# Patient Record
Sex: Female | Born: 1955 | Race: White | Hispanic: No | Marital: Married | Smoking: Never smoker
Health system: Southern US, Community
[De-identification: ages and names within clinical notes are randomized; demographics above are authoritative.]

## PROBLEM LIST (undated history)

## (undated) DIAGNOSIS — R011 Cardiac murmur, unspecified: Secondary | ICD-10-CM

## (undated) DIAGNOSIS — F32A Depression, unspecified: Secondary | ICD-10-CM

## (undated) DIAGNOSIS — G43909 Migraine, unspecified, not intractable, without status migrainosus: Secondary | ICD-10-CM

## (undated) HISTORY — PX: ROTATOR CUFF REPAIR: SHX139

---

## 1998-07-27 ENCOUNTER — Ambulatory Visit (HOSPITAL_COMMUNITY): Admission: RE | Admit: 1998-07-27 | Discharge: 1998-07-27 | Payer: Self-pay | Admitting: Internal Medicine

## 1998-07-28 ENCOUNTER — Encounter: Payer: Self-pay | Admitting: Internal Medicine

## 1999-10-14 ENCOUNTER — Other Ambulatory Visit: Admission: RE | Admit: 1999-10-14 | Discharge: 1999-10-14 | Payer: Self-pay | Admitting: *Deleted

## 1999-10-14 ENCOUNTER — Encounter (INDEPENDENT_AMBULATORY_CARE_PROVIDER_SITE_OTHER): Payer: Self-pay

## 2002-09-07 ENCOUNTER — Emergency Department (HOSPITAL_COMMUNITY): Admission: EM | Admit: 2002-09-07 | Discharge: 2002-09-07 | Payer: Self-pay | Admitting: Emergency Medicine

## 2002-09-08 ENCOUNTER — Encounter: Payer: Self-pay | Admitting: Emergency Medicine

## 2002-09-09 ENCOUNTER — Encounter: Payer: Self-pay | Admitting: Internal Medicine

## 2002-09-09 ENCOUNTER — Encounter: Admission: RE | Admit: 2002-09-09 | Discharge: 2002-09-09 | Payer: Self-pay | Admitting: Internal Medicine

## 2002-09-11 ENCOUNTER — Encounter: Admission: RE | Admit: 2002-09-11 | Discharge: 2002-09-11 | Payer: Self-pay | Admitting: Internal Medicine

## 2002-09-11 ENCOUNTER — Encounter: Payer: Self-pay | Admitting: Internal Medicine

## 2002-10-16 ENCOUNTER — Other Ambulatory Visit: Admission: RE | Admit: 2002-10-16 | Discharge: 2002-10-16 | Payer: Self-pay | Admitting: *Deleted

## 2004-03-17 ENCOUNTER — Observation Stay (HOSPITAL_COMMUNITY): Admission: RE | Admit: 2004-03-17 | Discharge: 2004-03-18 | Payer: Self-pay | Admitting: Orthopedic Surgery

## 2007-12-17 ENCOUNTER — Encounter: Admission: RE | Admit: 2007-12-17 | Discharge: 2007-12-17 | Payer: Self-pay | Admitting: Emergency Medicine

## 2008-01-09 ENCOUNTER — Ambulatory Visit: Payer: Self-pay | Admitting: *Deleted

## 2008-01-09 ENCOUNTER — Encounter (INDEPENDENT_AMBULATORY_CARE_PROVIDER_SITE_OTHER): Payer: Self-pay | Admitting: Family Medicine

## 2008-01-09 ENCOUNTER — Ambulatory Visit (HOSPITAL_COMMUNITY): Admission: RE | Admit: 2008-01-09 | Discharge: 2008-01-09 | Payer: Self-pay | Admitting: Family Medicine

## 2008-02-26 ENCOUNTER — Encounter: Admission: RE | Admit: 2008-02-26 | Discharge: 2008-03-12 | Payer: Self-pay | Admitting: Family Medicine

## 2009-09-14 ENCOUNTER — Encounter: Admission: RE | Admit: 2009-09-14 | Discharge: 2009-09-14 | Payer: Self-pay | Admitting: Family Medicine

## 2011-01-20 NOTE — Op Note (Signed)
Michelle Donaldson, Michelle Donaldson                       ACCOUNT NO.:  [id number]   MEDICAL RECORD NO.:  [id number]                   PATIENT TYPE:  AMB   LOCATION:  DAY                                  FACILITY:  [facility]   PHYSICIAN:  Georges Lynch. Darrelyn Hillock, M.D.             DATE OF BIRTH:  February 21, 1956   DATE OF PROCEDURE:  03/17/2004  DATE OF DISCHARGE:                                 OPERATIVE REPORT   SURGEON:  Georges Lynch. Darrelyn Hillock, M.D.   ASSISTANT:  Ebbie Ridge. Paitsel, P.A.   PREOPERATIVE DIAGNOSIS:  Severe compression syndrome of the right shoulder  with a possible rotator cuff tendon tear.   POSTOPERATIVE DIAGNOSES:  Severe impingement syndrome right shoulder with  chronic subdeltoid bursitis.   DESCRIPTION OF PROCEDURE:  Prior to taking the patient back for general  anesthetic, an interscalene block was administered by the anesthesiologist  in the holding area.  Following that, we brought her back, gave her general  anesthetic, did a routine orthopedic prep and draping of the right shoulder.  An incision was made over the anterior aspect of the right shoulder.  Bleeders were identified and cauterized.  I stripped the deltoid tendon from  the acromion and then inspected the acromion per se.  This acromion was an  abnormal wide acromion.  It was thickened.  It was sloped down.  It was  impinging on her cuff.  In fact, she had a severe adhesive-type chronic  bursitis where the bursa literally was adhesed to the acromion.  We broke up  all those adhesions by removing the bursa.  I then put one finger in and  dissected the acromion away from the tendon.  Following this, I inspected  the rotator cuff.  It was intact.  I then protected the cuff with a Bennett  retractor, and we did a partial acromionectomy and an acromioplasty.  Note,  her distal clavicle was not embedding into the cuff.  She was free in that  respect.  We thoroughly irrigated out the area, reapproximated the deltoid  tendon and the  muscle in the usual fashion.  We then closed the subcu with 0  Vicryl, skin with metal staples.  A sterile Neosporin dressing was applied.  She was placed in a sling.  She had 1 g of IV Ancef preop.                                               Ronald A. Darrelyn Hillock, M.D.    RAG/MEDQ  D:  03/17/2004  T:  03/17/2004  Job:  [id number]

## 2011-09-12 ENCOUNTER — Ambulatory Visit (INDEPENDENT_AMBULATORY_CARE_PROVIDER_SITE_OTHER): Payer: Self-pay | Admitting: Family Medicine

## 2011-09-12 DIAGNOSIS — Z713 Dietary counseling and surveillance: Secondary | ICD-10-CM

## 2012-06-04 ENCOUNTER — Encounter (INDEPENDENT_AMBULATORY_CARE_PROVIDER_SITE_OTHER): Payer: 59

## 2012-06-04 DIAGNOSIS — R002 Palpitations: Secondary | ICD-10-CM

## 2013-09-04 DIAGNOSIS — F909 Attention-deficit hyperactivity disorder, unspecified type: Secondary | ICD-10-CM

## 2013-09-04 HISTORY — DX: Attention-deficit hyperactivity disorder, unspecified type: F90.9

## 2014-03-04 ENCOUNTER — Emergency Department (HOSPITAL_COMMUNITY)
Admission: EM | Admit: 2014-03-04 | Discharge: 2014-03-04 | Disposition: A | Discharge status: Home / Self Care | Payer: 59 | Attending: Emergency Medicine | Admitting: Emergency Medicine

## 2014-03-04 ENCOUNTER — Emergency Department (HOSPITAL_COMMUNITY): Payer: 59

## 2014-03-04 ENCOUNTER — Encounter (HOSPITAL_COMMUNITY): Payer: Self-pay | Admitting: Emergency Medicine

## 2014-03-04 DIAGNOSIS — Y9389 Activity, other specified: Secondary | ICD-10-CM | POA: Insufficient documentation

## 2014-03-04 DIAGNOSIS — S0990XA Unspecified injury of head, initial encounter: Secondary | ICD-10-CM | POA: Insufficient documentation

## 2014-03-04 DIAGNOSIS — W010XXA Fall on same level from slipping, tripping and stumbling without subsequent striking against object, initial encounter: Secondary | ICD-10-CM | POA: Insufficient documentation

## 2014-03-04 DIAGNOSIS — Y9289 Other specified places as the place of occurrence of the external cause: Secondary | ICD-10-CM | POA: Insufficient documentation

## 2014-03-04 DIAGNOSIS — S0003XA Contusion of scalp, initial encounter: Secondary | ICD-10-CM | POA: Insufficient documentation

## 2014-03-04 DIAGNOSIS — S0083XA Contusion of other part of head, initial encounter: Secondary | ICD-10-CM | POA: Insufficient documentation

## 2014-03-04 DIAGNOSIS — Z8669 Personal history of other diseases of the nervous system and sense organs: Secondary | ICD-10-CM | POA: Insufficient documentation

## 2014-03-04 DIAGNOSIS — S1093XA Contusion of unspecified part of neck, initial encounter: Secondary | ICD-10-CM

## 2014-03-04 DIAGNOSIS — Z91040 Latex allergy status: Secondary | ICD-10-CM | POA: Insufficient documentation

## 2014-03-04 HISTORY — DX: Migraine, unspecified, not intractable, without status migrainosus: G43.909

## 2014-03-04 MED ORDER — KETOROLAC TROMETHAMINE 60 MG/2ML IM SOLN
60.0000 mg | Freq: Once | INTRAMUSCULAR | Status: AC
  Administered 2014-03-04: 60 mg via INTRAMUSCULAR
  Filled 2014-03-04: qty 2

## 2014-03-04 NOTE — ED Provider Notes (Signed)
CSN: [id number]     Arrival date & time 03/04/14  1602 History   First MD Initiated Contact with Patient 03/04/14 1630     Chief Complaint  Patient presents with  . Fall  . Head Injury     (Consider location/radiation/quality/duration/timing/severity/associated sxs/prior Treatment) HPI Michelle Donaldson is a 58 y.o. female who presents to emergency department complaining of a head injury. Patient states she fell early this morning after tripping and hit her head on a brick step. Patient reports hematoma and in pain to the left for head. She states she did not have any loss of consciousness, states "I saw stars." She states she put some frozen peas to reduce the swelling which has helped some. She states she took Tylenol for pain which did not help her headache. She states since this morning after the fall, she has had persistent headache that is now on the right side. She admits to dizziness. She denies any nausea or vomiting. No confusion, no memory loss, no weakness or numbness in extremities. Denies any neck pain or back pain. No other injuries. She denies being on any blood thinners. No other complaints  Past Medical History  Diagnosis Date  . Migraine    Past Surgical History  Procedure Laterality Date  . Cesarean section    . Rotator cuff repair     No family history on file. History  Substance Use Topics  . Smoking status: Never Smoker   . Smokeless tobacco: Not on file  . Alcohol Use: Yes     Comment: rarely   OB History   Grav Para Term Preterm Abortions TAB SAB Ect Mult Living                 Review of Systems  Constitutional: Negative for fever and chills.  Respiratory: Negative for cough, chest tightness and shortness of breath.   Cardiovascular: Negative for chest pain, palpitations and leg swelling.  Gastrointestinal: Negative for nausea, vomiting, abdominal pain and diarrhea.  Musculoskeletal: Negative for arthralgias, myalgias, neck pain and neck stiffness.   Skin: Negative for rash.  Neurological: Positive for dizziness, light-headedness and headaches. Negative for syncope, speech difficulty, weakness and numbness.  All other systems reviewed and are negative.     Allergies  Latex  Home Medications   Prior to Admission medications   Not on File   BP 133/60  Pulse 77  Temp(Src) 98.4 F (36.9 C) (Oral)  Resp 16  SpO2 95% Physical Exam  Nursing note and vitals reviewed. Constitutional: She is oriented to person, place, and time. She appears well-developed and well-nourished. No distress.  HENT:  Head: Normocephalic.  Right Ear: External ear normal.  Left Ear: External ear normal.  Small hematoma to the left forehead  Eyes: Conjunctivae and EOM are normal. Pupils are equal, round, and reactive to light.  Neck: Normal range of motion. Neck supple.  Cardiovascular: Normal rate, regular rhythm and normal heart sounds.   Pulmonary/Chest: Effort normal and breath sounds normal. No respiratory distress. She has no wheezes. She has no rales.  Abdominal: Soft. Bowel sounds are normal. She exhibits no distension. There is no tenderness. There is no rebound.  Musculoskeletal: She exhibits no edema.  No cervical, thoracic, lumbar spine tenderness  Neurological: She is alert and oriented to person, place, and time.  5/5 and equal upper and lower extremity strength bilaterally. Equal grip strength bilaterally. Normal finger to nose and heel to shin. No pronator drift.   Skin: Skin is  warm and dry.  Psychiatric: She has a normal mood and affect. Her behavior is normal.    ED Course  Procedures (including critical care time) Labs Review Labs Reviewed - No data to display  Imaging Review Ct Head Wo Contrast  03/04/2014   CLINICAL DATA:  Head trauma status post fall  EXAM: CT HEAD WITHOUT CONTRAST  TECHNIQUE: Contiguous axial images were obtained from the base of the skull through the vertex without intravenous contrast.  COMPARISON:  None.   FINDINGS: The ventricles are normal in size and position. There is no intracranial hemorrhage nor intracranial mass effect. No acute ischemic changes demonstrated. The cerebellum and brainstem are normal. The observed paranasal sinuses and mastoid air cells are clear. There is no acute skull fracture. No significant cephalohematoma is demonstrated.  IMPRESSION: There is no acute intracranial abnormality.   Electronically Signed   By: David  Swazil[place]   On: 03/04/2014 17:28     EKG Interpretation None      MDM   Final diagnoses:  Minor head injury, initial encounter    Pt here after a head injury, not on blood thinners, normal neurological exam. Pt requesting CT, pt does have dizziness and severe headache, and I do think it may be appropriate to get one.   5:45 PM CT head negative. Patient reassured. She has no other complaints. Toradol 60 mg given IM for her headache. We'll discharge home with outpatient followup as needed. Precautions discussed.  Filed Vitals:   03/04/14 1614  BP: 133/60  Pulse: 77  Temp: 98.4 F (36.9 C)  Resp: 16     Jailani Hogans A Odes Lolli, PA-C 03/04/14 2332

## 2014-03-04 NOTE — ED Notes (Addendum)
Pt c/o headache after tripping and falling and hitting the L side of her head on a brick step. Pt denies LOC, or getting dizzy and falling. Pt sts that "she saw stars" right after the incident but never blacked out. Pt denies dizziness, lightheadedness, N/V at this time. Pt was persuaded by husband to "come get checked out" and would also like treatment for headache. Pt has hx of migraines. Pt has knot on L side of face above eyebrow. Pt put ice on it right after she fell. Pt only c/o headache. Pt sts that when she bends down she become dizzy.

## 2014-03-04 NOTE — Discharge Instructions (Signed)
Continue tylenol or motrin for headache. Ice the swollen area. Follow up with your doctor. Return if worsening symptoms.    Head Injury You have received a head injury. It does not appear serious at this time. Headaches and vomiting are common following head injury. It should be easy to awaken from sleeping. Sometimes it is necessary for you to stay in the emergency department for a while for observation. Sometimes admission to the hospital may be needed. After injuries such as yours, most problems occur within the first 24 hours, but side effects may occur up to 7-10 days after the injury. It is important for you to carefully monitor your condition and contact your health care provider or seek immediate medical care if there is a change in your condition. WHAT ARE THE TYPES OF HEAD INJURIES? Head injuries can be as minor as a bump. Some head injuries can be more severe. More severe head injuries include:  A jarring injury to the brain (concussion).  A bruise of the brain (contusion). This mean there is bleeding in the brain that can cause swelling.  A cracked skull (skull fracture).  Bleeding in the brain that collects, clots, and forms a bump (hematoma). WHAT CAUSES A HEAD INJURY? A serious head injury is most likely to happen to someone who is in a car wreck and is not wearing a seat belt. Other causes of major head injuries include bicycle or motorcycle accidents, sports injuries, and falls. HOW ARE HEAD INJURIES DIAGNOSED? A complete history of the event leading to the injury and your current symptoms will be helpful in diagnosing head injuries. Many times, pictures of the brain, such as CT or MRI are needed to see the extent of the injury. Often, an overnight hospital stay is necessary for observation.  WHEN SHOULD I SEEK IMMEDIATE MEDICAL CARE?  You should get help right away if:  You have confusion or drowsiness.  You feel sick to your stomach (nauseous) or have continued, forceful  vomiting.  You have dizziness or unsteadiness that is getting worse.  You have severe, continued headaches not relieved by medicine. Only take over-the-counter or prescription medicines for pain, fever, or discomfort as directed by your health care provider.  You do not have normal function of the arms or legs or are unable to walk.  You notice changes in the black spots in the center of the colored part of your eye (pupil).  You have a clear or bloody fluid coming from your nose or ears.  You have a loss of vision. During the next 24 hours after the injury, you must stay with someone who can watch you for the warning signs. This person should contact local emergency services (911 in the [place] if you have seizures, you become unconscious, or you are unable to wake up. HOW CAN I PREVENT A HEAD INJURY IN THE FUTURE? The most important factor for preventing major head injuries is avoiding motor vehicle accidents. To minimize the potential for damage to your head, it is crucial to wear seat belts while riding in motor vehicles. Wearing helmets while bike riding and playing collision sports (like football) is also helpful. Also, avoiding dangerous activities around the house will further help reduce your risk of head injury.  WHEN CAN I RETURN TO NORMAL ACTIVITIES AND ATHLETICS? You should be reevaluated by your health care provider before returning to these activities. If you have any of the following symptoms, you should not return to activities or contact sports until  1 week after the symptoms have stopped:  Persistent headache.  Dizziness or vertigo.  Poor attention and concentration.  Confusion.  Memory problems.  Nausea or vomiting.  Fatigue or tire easily.  Irritability.  Intolerant of bright lights or loud noises.  Anxiety or depression.  Disturbed sleep. MAKE SURE YOU:   Understand these instructions.  Will watch your condition.  Will get help right away if you are  not doing well or get worse. Document Released: 08/21/2005 Document Revised: 08/26/2013 Document Reviewed: 04/28/2013 [facility] Patient Information 2015 [employer], [place]. This information is not intended to replace advice given to you by your health care provider. Make sure you discuss any questions you have with your health care provider.

## 2014-03-05 NOTE — ED Provider Notes (Signed)
Medical screening examination/treatment/procedure(s) were performed by non-physician practitioner and as supervising physician I was immediately available for consultation/collaboration.  Toy BakerAnthony T Amiere Cawley, MD 03/05/14 [phone]

## 2014-09-16 ENCOUNTER — Ambulatory Visit: Payer: 59 | Admitting: Podiatry

## 2014-12-09 ENCOUNTER — Encounter: Payer: Self-pay | Admitting: Podiatry

## 2014-12-09 ENCOUNTER — Ambulatory Visit (INDEPENDENT_AMBULATORY_CARE_PROVIDER_SITE_OTHER): Payer: 59

## 2014-12-09 ENCOUNTER — Ambulatory Visit (INDEPENDENT_AMBULATORY_CARE_PROVIDER_SITE_OTHER): Payer: 59 | Admitting: Podiatry

## 2014-12-09 VITALS — BP 120/84 | HR 84

## 2014-12-09 DIAGNOSIS — R52 Pain, unspecified: Secondary | ICD-10-CM

## 2014-12-09 DIAGNOSIS — L84 Corns and callosities: Secondary | ICD-10-CM | POA: Diagnosis not present

## 2014-12-09 DIAGNOSIS — M205X9 Other deformities of toe(s) (acquired), unspecified foot: Secondary | ICD-10-CM

## 2014-12-09 NOTE — Progress Notes (Signed)
   Subjective:    Patient ID: Kathrin GreathouseLinda B Governale, female    DOB: 01/19/1956, 59 y.o.   MRN: [id number]  HPI  59 year old female presents the office they with a painful corn on the lateral aspect of the left fourth digit. She states that she has been using corn remover pads on it without much resolution. She previously states that she has surgery on the toe proximal 30 years ago to remove a bone spur for similar issue here he states she states the last couple months and started to recur. Denies any history of injury or trauma. Denies any overlying swallowing or drainage. No other complaints at this time.    Review of Systems  All other systems reviewed and are negative.      Objective:   Physical Exam  AAO 3, NAD DP/PT pulses palpable, CRT less than 3 seconds  Protective sensation intact with Simms weinstein monofilament, vibratory sensation intact, Achilles tendon reflex intact. On the lateral aspect left fourth toe there is a hyperkeratotic lesion at the level of the PIPJ. 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 There is no overlying edema to the digits. There is no erythema or increase in warmth. Upon debridement lesion there is no underlying ulceration, drainage or other clinical signs of infection. No other areas of tenderness to bilateral lower extremities. MMT 5/5, ROM WNL No open lesions, pre-ulcerative lesions No pain with calf compression, swelling, warmth, erythema.      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 -Treatment options were discussed including alternatives, risks, complications. I discussed both conservative and surgical options. -Hyperkeratotic lesion sharply debrided without, occasions. A pad was placed around the area followed by salinocaine and a bandage. Post procedure care was discussed the patient.  Monitor for signs or symptoms of infection to call the office if any is to occur. Also discussed toe separators as well offloading pads. Follow-up as needed. In the meantime encouraged to call the office with any questions, concerns, changes symptoms.

## 2016-12-01 DIAGNOSIS — H9193 Unspecified hearing loss, bilateral: Secondary | ICD-10-CM | POA: Insufficient documentation

## 2016-12-01 DIAGNOSIS — H6983 Other specified disorders of Eustachian tube, bilateral: Secondary | ICD-10-CM | POA: Insufficient documentation

## 2016-12-01 DIAGNOSIS — T161XXA Foreign body in right ear, initial encounter: Secondary | ICD-10-CM | POA: Insufficient documentation

## 2016-12-01 DIAGNOSIS — H6993 Unspecified Eustachian tube disorder, bilateral: Secondary | ICD-10-CM | POA: Insufficient documentation

## 2016-12-01 DIAGNOSIS — J342 Deviated nasal septum: Secondary | ICD-10-CM | POA: Insufficient documentation

## 2016-12-21 ENCOUNTER — Ambulatory Visit (INDEPENDENT_AMBULATORY_CARE_PROVIDER_SITE_OTHER): Payer: 59 | Admitting: Podiatry

## 2016-12-21 ENCOUNTER — Ambulatory Visit (INDEPENDENT_AMBULATORY_CARE_PROVIDER_SITE_OTHER): Payer: 59

## 2016-12-21 DIAGNOSIS — M79672 Pain in left foot: Secondary | ICD-10-CM

## 2016-12-21 DIAGNOSIS — L84 Corns and callosities: Secondary | ICD-10-CM | POA: Diagnosis not present

## 2016-12-21 DIAGNOSIS — L989 Disorder of the skin and subcutaneous tissue, unspecified: Secondary | ICD-10-CM | POA: Diagnosis not present

## 2016-12-21 NOTE — Progress Notes (Signed)
Subjective: 61 year old female presents the office today for concerns of recurrent lesion to the left fourth toe which is been ongoing for about 6-8 months. She says the areas painful pressure in shoes. She states that she was doing well up until about a month ago when it started to worsen. Denies CBC injury. No swelling or redness or any drainage from the area. Denies any systemic complaints such as fevers, chills, nausea, vomiting. No acute changes since last appointment, and no other complaints at this time.   Objective: AAO x3, NAD DP/PT pulses palpable bilaterally, CRT less than 3 seconds Prominences present on the lateral aspect of the left fourth digit was abutting the fifth toe. This hyperkeratotic lesion the lateral fourth toe. Upon debridement no underlying ulceration, drainage or any signs of infection. No other open lesions or pre-ulceration. There is no pain with calf compression, swelling, warmth, erythema.  Assessment: Hyperkeratotic lesion left fourth toe   Plan: -Treatment options discussed including all alternatives, risks, and complications -Etiology of symptoms were discussed -X-rays were obtained and reviewed with the patient. No significant bony exostosis present. No evidence of acute fracture.  -Lesion was sharply debrided 1 without complications or bleeding. Area was cleaned and a pad was placed followed by salicylic acid and a bandage. Post procedure first discussed. Monitor for infection.  -RTC is symptoms worsen or fail to improve.   Ovid Curd, DPM

## 2017-01-09 DIAGNOSIS — J302 Other seasonal allergic rhinitis: Secondary | ICD-10-CM | POA: Insufficient documentation

## 2017-06-14 ENCOUNTER — Ambulatory Visit
Admission: RE | Admit: 2017-06-14 | Discharge: 2017-06-14 | Disposition: A | Discharge status: Home / Self Care | Payer: 59 | Source: Ambulatory Visit | Attending: Family Medicine | Admitting: Family Medicine

## 2017-06-14 ENCOUNTER — Other Ambulatory Visit: Payer: Self-pay | Admitting: Family Medicine

## 2017-06-14 DIAGNOSIS — R609 Edema, unspecified: Secondary | ICD-10-CM

## 2017-06-19 ENCOUNTER — Other Ambulatory Visit: Payer: Self-pay | Admitting: Family Medicine

## 2017-06-19 DIAGNOSIS — R221 Localized swelling, mass and lump, neck: Secondary | ICD-10-CM

## 2017-06-20 ENCOUNTER — Ambulatory Visit
Admission: RE | Admit: 2017-06-20 | Discharge: 2017-06-20 | Disposition: A | Discharge status: Home / Self Care | Payer: 59 | Source: Ambulatory Visit | Attending: Family Medicine | Admitting: Family Medicine

## 2017-06-20 DIAGNOSIS — R221 Localized swelling, mass and lump, neck: Secondary | ICD-10-CM

## 2017-06-27 ENCOUNTER — Other Ambulatory Visit: Payer: Self-pay | Admitting: Family Medicine

## 2017-06-27 DIAGNOSIS — R222 Localized swelling, mass and lump, trunk: Secondary | ICD-10-CM

## 2017-06-29 ENCOUNTER — Other Ambulatory Visit: Payer: 59

## 2017-07-03 ENCOUNTER — Ambulatory Visit
Admission: RE | Admit: 2017-07-03 | Discharge: 2017-07-03 | Disposition: A | Discharge status: Home / Self Care | Payer: 59 | Source: Ambulatory Visit | Attending: Family Medicine | Admitting: Family Medicine

## 2017-07-03 DIAGNOSIS — R222 Localized swelling, mass and lump, trunk: Secondary | ICD-10-CM

## 2017-07-03 MED ORDER — IOPAMIDOL (ISOVUE-300) INJECTION 61%
75.0000 mL | Freq: Once | INTRAVENOUS | Status: AC | PRN
  Administered 2017-07-03: 75 mL via INTRAVENOUS

## 2017-07-05 ENCOUNTER — Ambulatory Visit (INDEPENDENT_AMBULATORY_CARE_PROVIDER_SITE_OTHER): Payer: 59 | Admitting: Podiatry

## 2017-07-05 ENCOUNTER — Encounter: Payer: Self-pay | Admitting: Podiatry

## 2017-07-05 DIAGNOSIS — L989 Disorder of the skin and subcutaneous tissue, unspecified: Secondary | ICD-10-CM | POA: Diagnosis not present

## 2017-07-05 DIAGNOSIS — M2042 Other hammer toe(s) (acquired), left foot: Secondary | ICD-10-CM | POA: Diagnosis not present

## 2017-07-09 NOTE — Progress Notes (Signed)
Subjective: 61 year old female presents the office today for concerns of recurrent lesion to the left fourth toe. She presents today to have the area trimmed that she is also interested in surgical intervention to help decrease the pain and deformity to help eliminate the callus. Which she has tried periodic trimmings as well as offloading pads in shoe changes without any significant improvement. She denies any open sores to the area and she denies any swelling or redness or drainage. She has no other concerns today. Denies any systemic complaints such as fevers, chills, nausea, vomiting. No acute changes since last appointment, and no other complaints at this time.   Objective: AAO x3, NAD DP/PT pulses palpable bilaterally, CRT less than 3 seconds Prominences present on the lateral aspect of the left fourth digit was abutting the fifth toe. This hyperkeratotic lesion the lateral fourth toe. Upon debridement no underlying ulceration, drainage or any signs of infection. No other open lesions or pre-ulceration. There is no pain with calf compression, swelling, warmth, erythema.  Assessment: Hyperkeratotic lesion left fourth toe   Plan: -Treatment options discussed including all alternatives, risks, and complications -Etiology of symptoms were discussed -Hyperkeratotic lesion was sharply debrided 1 without any complications or bleeding. -We discussed surgical intervention including arthroplasty of the fourth and fifth toes to help take pressure off the digits. I discussed with her the surgery as well as the postoperative course. She may consider her options. I gave her the number to contact our surgery scheduler if she wishes to proceed with surgery. I will need to see her back for consent forms.  -RTC is symptoms worsen or fail to improve.   Ovid CurdMatthew Wagoner, DPM

## 2017-08-08 ENCOUNTER — Other Ambulatory Visit: Payer: Self-pay | Admitting: Family Medicine

## 2017-08-08 ENCOUNTER — Ambulatory Visit
Admission: RE | Admit: 2017-08-08 | Discharge: 2017-08-08 | Disposition: A | Discharge status: Home / Self Care | Payer: 59 | Source: Ambulatory Visit | Attending: Family Medicine | Admitting: Family Medicine

## 2017-08-08 DIAGNOSIS — M79604 Pain in right leg: Secondary | ICD-10-CM

## 2017-09-04 HISTORY — PX: TOE SURGERY: SHX1073

## 2017-12-04 DIAGNOSIS — K219 Gastro-esophageal reflux disease without esophagitis: Secondary | ICD-10-CM | POA: Insufficient documentation

## 2017-12-04 DIAGNOSIS — R490 Dysphonia: Secondary | ICD-10-CM | POA: Insufficient documentation

## 2018-03-09 IMAGING — US US SOFT TISSUE EXCLUDE HEAD/NECK
1 series · 14 of 16 positions shown · non-contrast
Comparison: None.

CLINICAL DATA: Palpable mass left chest

EXAM:
ULTRASOUND OF HEAD/NECK SOFT TISSUES
TECHNIQUE: Ultrasound examination of the head and neck soft tissues was
performed in the area of clinical concern.

[Series 1: us soft tissue exclude head/neck · 0.15mm/px · 16 acquisitions, 14 frames shown]
[im 1/16]
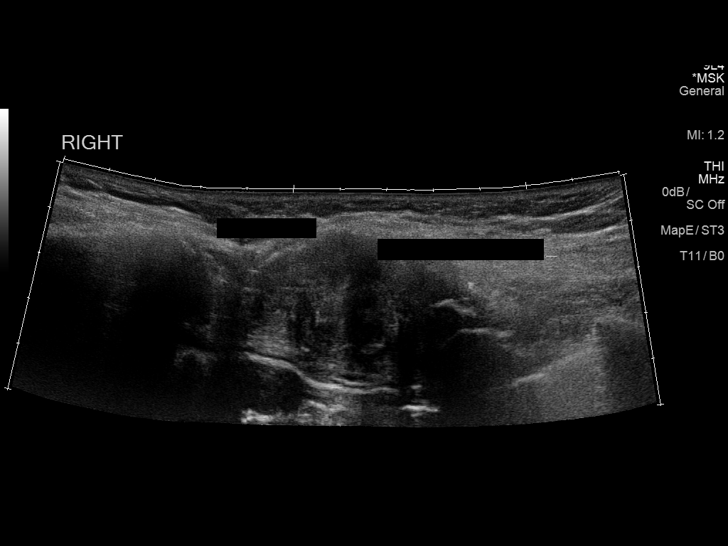
[im 2/16]
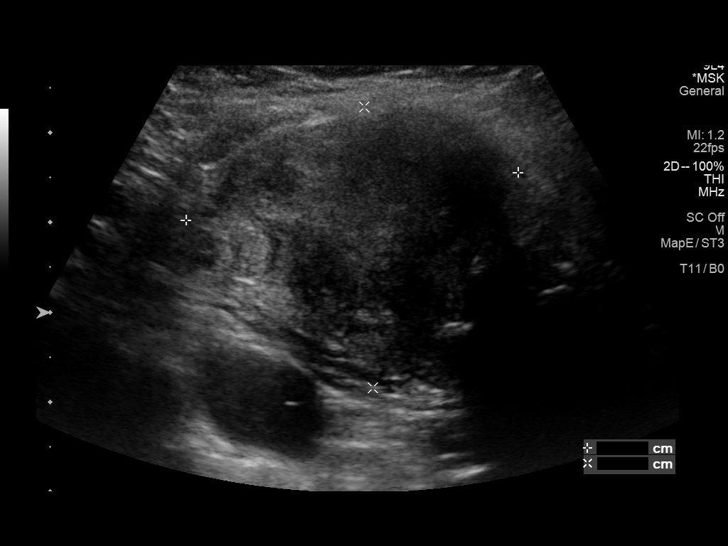
[im 3/16]
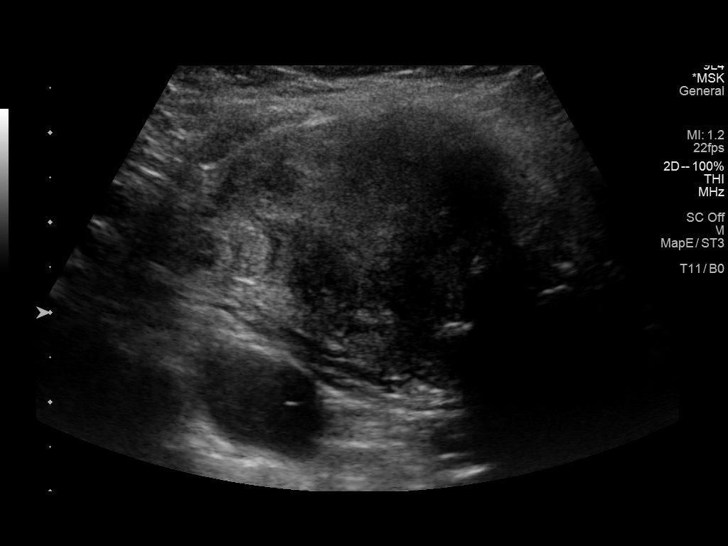
[im 5/16]
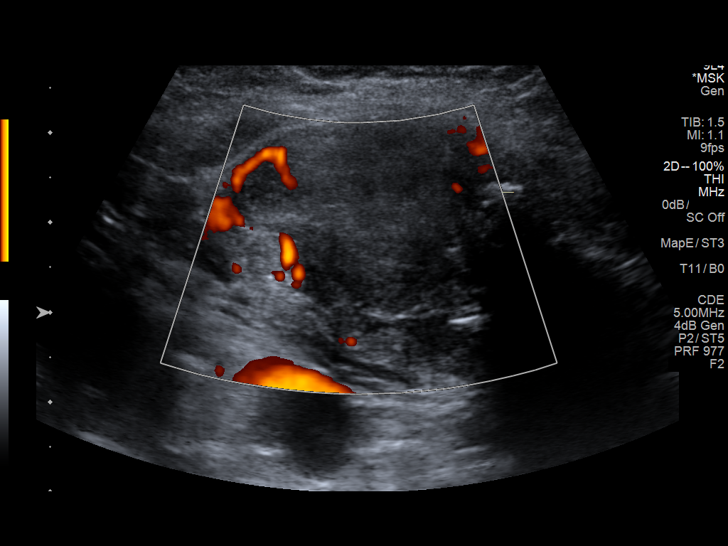
[im 6/16]
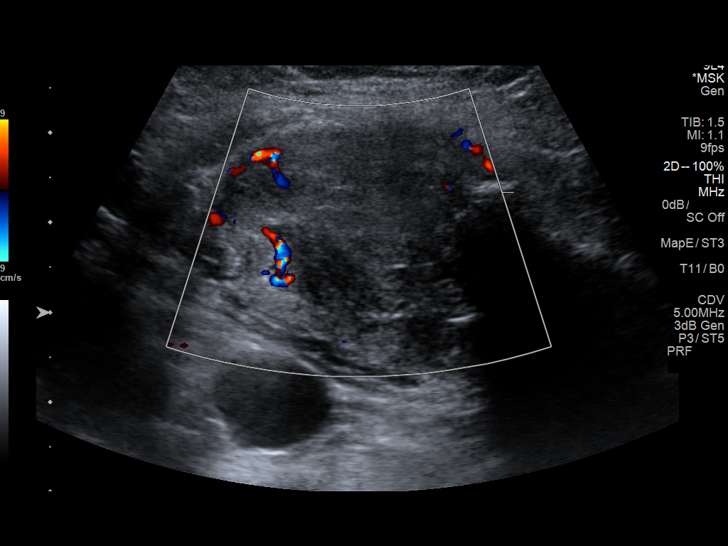
[im 7/16]
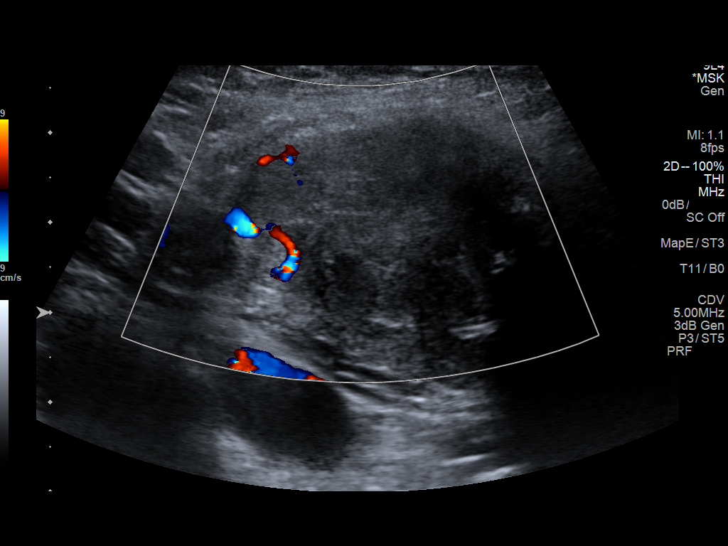
[im 8/16]
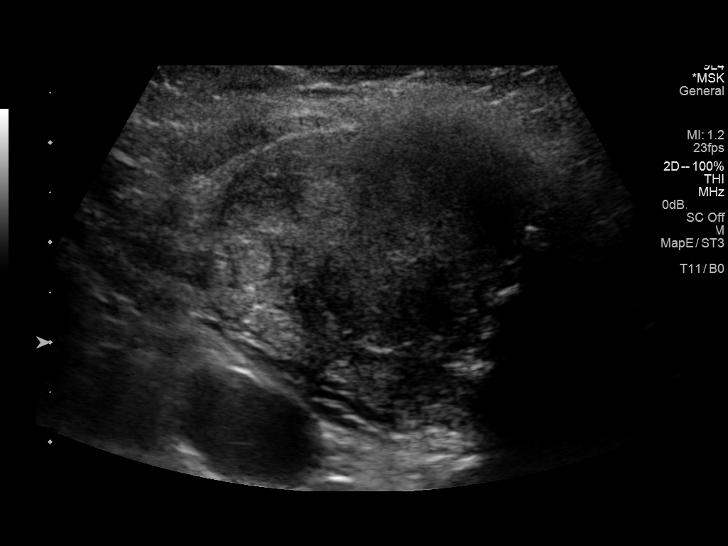
[im 9/16]
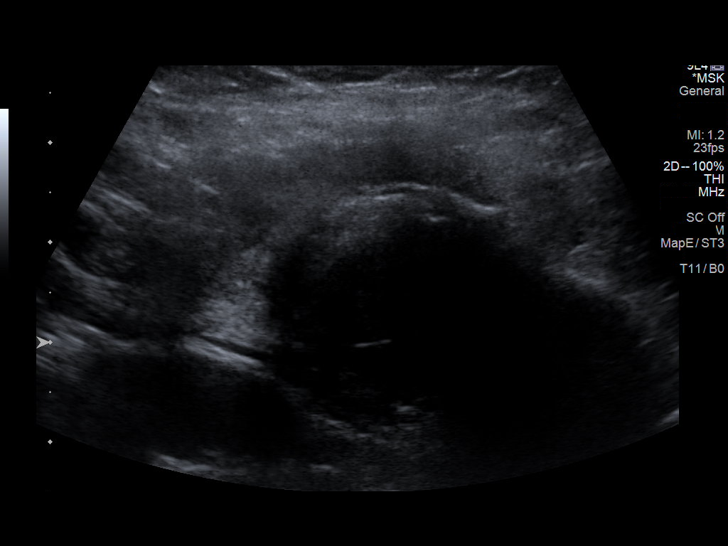
[im 10/16]
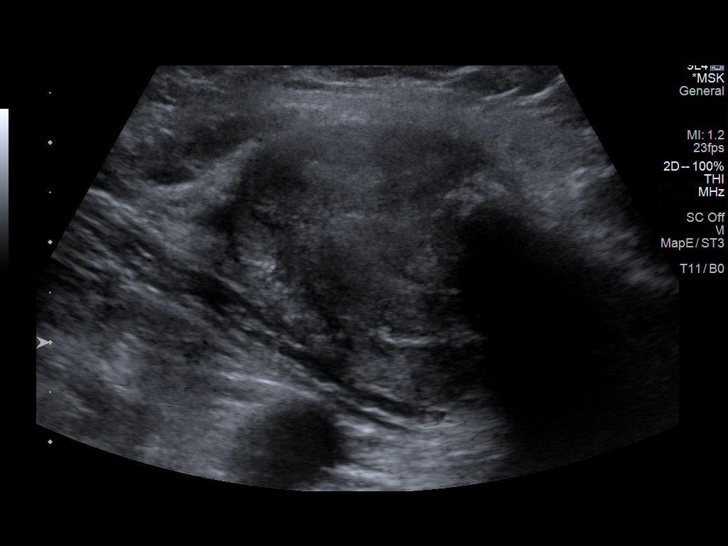
[im 11/16]
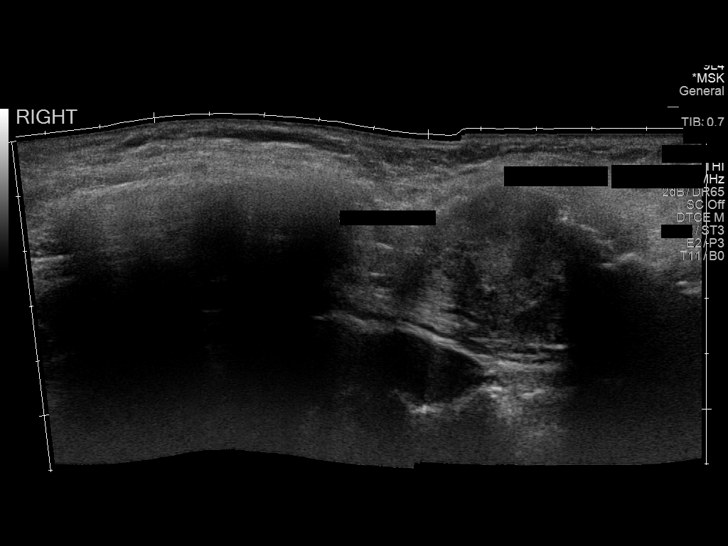
[im 13/16]
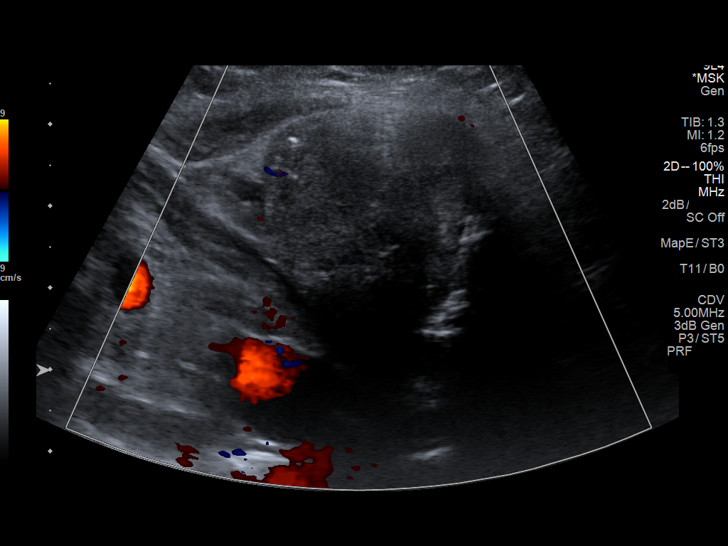
[im 14/16]
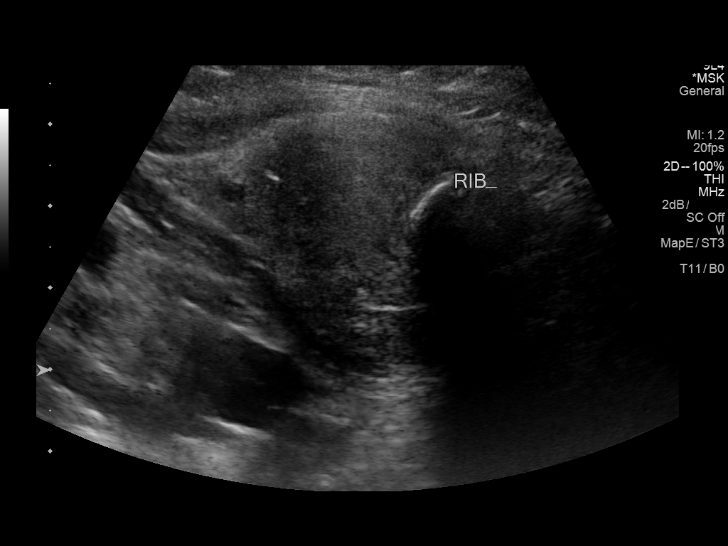
[im 15/16]
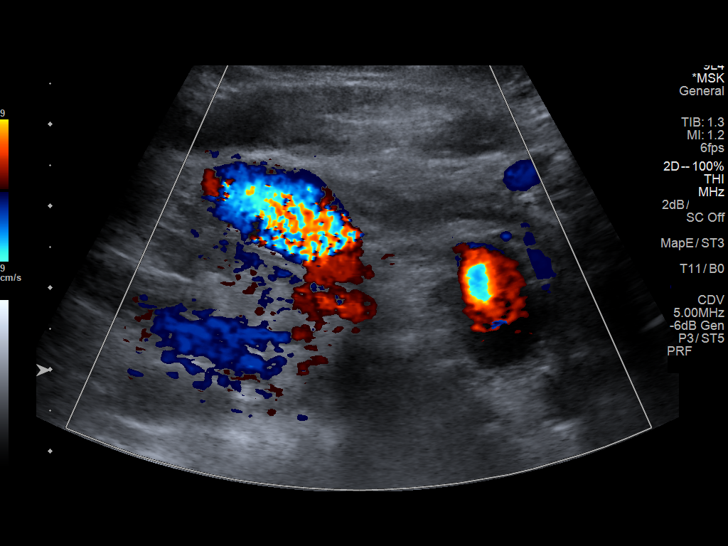
[im 16/16]
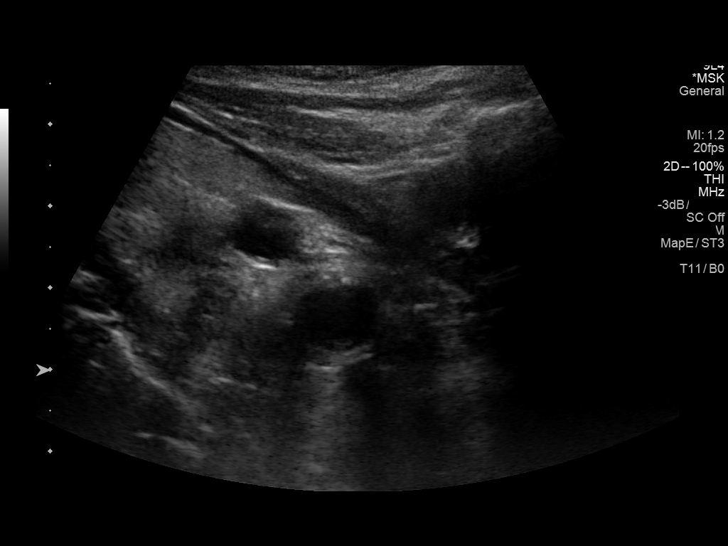

[14 of 16 positions shown; findings below may reference images not displayed]

FINDINGS: Targeted sonography of the area of concern is performed. This is
labeled as between the left medial clavicle and rib.

Corresponding to the palpable mass is a solid appearing slightly
heterogeneous hypoechoic mass measuring 3.7 x 3.1 x 2.8 cm. Internal
flow is present.
IMPRESSION: 3.7 cm solid-appearing mass corresponding to the palpable
abnormality between the medial end of left clavicle and rib. Further
evaluation with CT and/or MRI is recommended.

These results will be called to the ordering clinician or
representative by the Radiologist Assistant, and communication
documented in the PACS or zVision Dashboard.

## 2018-07-15 ENCOUNTER — Ambulatory Visit: Payer: 59 | Admitting: Podiatry

## 2018-07-15 ENCOUNTER — Ambulatory Visit (INDEPENDENT_AMBULATORY_CARE_PROVIDER_SITE_OTHER): Payer: 59

## 2018-07-15 DIAGNOSIS — M7752 Other enthesopathy of left foot: Secondary | ICD-10-CM

## 2018-07-15 DIAGNOSIS — M205X2 Other deformities of toe(s) (acquired), left foot: Secondary | ICD-10-CM

## 2018-07-15 DIAGNOSIS — L84 Corns and callosities: Secondary | ICD-10-CM

## 2018-07-15 DIAGNOSIS — M898X9 Other specified disorders of bone, unspecified site: Secondary | ICD-10-CM | POA: Diagnosis not present

## 2018-07-15 DIAGNOSIS — M79672 Pain in left foot: Secondary | ICD-10-CM

## 2018-07-16 ENCOUNTER — Other Ambulatory Visit: Payer: Self-pay | Admitting: Podiatry

## 2018-07-16 DIAGNOSIS — M7752 Other enthesopathy of left foot: Secondary | ICD-10-CM

## 2018-07-17 NOTE — Progress Notes (Signed)
Subjective: 62 year old female presents the office today for concerns of recurrent lesion to the left fourth toe.  She states that she tries to trim herself but is still causing quite a bit of pain.  She does relate that she had surgery to her fourth and fifth toe several years ago and did well however has been causing pain in the last couple years.  She denies any redness or drainage or any swelling or pus.  No recent injury or trauma.  His pain with pressure in certain shoes.  She has no other concerns.  Denies any systemic complaints such as fevers, chills, nausea, vomiting. No acute changes since last appointment, and no other complaints at this time.   Objective: AAO x3, NAD DP/PT pulses palpable bilaterally, CRT less than 3 seconds Prominences present on the lateral aspect of the left fourth digit was abutting the fifth toe. This hyperkeratotic lesion the lateral fourth toe. Upon debridement no underlying ulceration, drainage or any signs of infection.  Adductovarus present fifth toe No other open lesions or pre-ulceration. There is no pain with calf compression, swelling, warmth, erythema.  Assessment: Hyperkeratotic lesion left fourth toe   Plan: -Treatment options discussed including all alternatives, risks, and complications -X-rays were obtained reviewed.  Small exostosis lateral fourth PIPJ.  Adductovarus present of the fifth toe.  No evidence of acute fracture. -Hyperkeratotic lesion was sharply debrided 1 without any complications or bleeding. -We discussed surgical intervention including arthroplasty of the fourth and fifth toes to help take pressure off the digits.  She is to consider doing this in the spring of next year. -RTC is symptoms worsen or fail to improve.   Ovid CurdMatthew Esha Fincher, DPM

## 2018-09-19 ENCOUNTER — Ambulatory Visit: Payer: 59 | Admitting: Podiatry

## 2018-09-19 ENCOUNTER — Ambulatory Visit (INDEPENDENT_AMBULATORY_CARE_PROVIDER_SITE_OTHER): Payer: 59

## 2018-09-19 ENCOUNTER — Other Ambulatory Visit: Payer: Self-pay | Admitting: Podiatry

## 2018-09-19 ENCOUNTER — Encounter: Payer: Self-pay | Admitting: Podiatry

## 2018-09-19 DIAGNOSIS — M898X9 Other specified disorders of bone, unspecified site: Secondary | ICD-10-CM | POA: Diagnosis not present

## 2018-09-19 DIAGNOSIS — L84 Corns and callosities: Secondary | ICD-10-CM

## 2018-09-19 DIAGNOSIS — M2042 Other hammer toe(s) (acquired), left foot: Secondary | ICD-10-CM | POA: Diagnosis not present

## 2018-09-19 NOTE — Patient Instructions (Signed)

## 2018-09-22 NOTE — Progress Notes (Signed)
Subjective: 63 year old female presents the office today for concerns of recurrent painful corn on the left fourth toe when she wants to discuss surgical intervention.  She said the last time did not last as long as it normally has not started to come back quicker.  She is attempted some over-the-counter acid treatments but this is been helping some and she comes in for periodic debridement.  She is also tried changing shoes without any significant improvement.  Due to the lesion, back very quickly she wants to discuss surgical intervention.  She currently denies any drainage or pus or any swelling. Denies any systemic complaints such as fevers, chills, nausea, vomiting. No acute changes since last appointment, and no other complaints at this time.   Objective: AAO x3, NAD DP/PT pulses palpable bilaterally, CRT less than 3 seconds There is prominence along the lateral aspect of the fourth toe along the DIPJ and PIPJ resulting hyperkeratotic lesion.  Mild digital deformities present to the lesser digits.  Upon debridement of the hyperkeratotic lesion there is no underlying ulceration, drainage or any clinical signs of infection noted today. No open lesions or pre-ulcerative lesions.  No pain with calf compression, swelling, warmth, erythema  Assessment: Hyperkeratotic lesion left fourth toe  Plan: -All treatment options discussed with the patient including all alternatives, risks, complications.  -Today new x-rays were obtained reviewed a skin marker was utilized to identify the area of the soft tissue mass.  No evidence of acute fracture. -We discussed both conservative as well as surgical treatment options.  This point she wants to consider surgical intervention.  We discussed multiple options however we discussed ultimately doing an exostectomy of the left fourth toe.  We discussed the surgery as well as the postoperative course.  She wishes to proceed with surgery for exostectomy of the left  fourth toe. -The incision placement as well as the postoperative course was discussed with the patient. I discussed risks of the surgery which include, but not limited to, infection, bleeding, pain, swelling, need for further surgery, delayed or nonhealing, painful or ugly scar, numbness or sensation changes, over/under correction, recurrence, transfer lesions, further deformity, hardware failure, DVT/PE, loss of toe/foot. Patient understands these risks and wishes to proceed with surgery. The surgical consent was reviewed with the patient all 3 pages were signed. No promises or guarantees were given to the outcome of the procedure. All questions were answered to the best of my ability. Before the surgery the patient was encouraged to call the office if there is any further questions. The surgery will be performed at the [facility] on an outpatient basis. -This was scheduled for February 12. -Patient encouraged to call the office with any questions, concerns, change in symptoms.   Vivi Barrack DPM

## 2018-10-14 ENCOUNTER — Telehealth: Payer: Self-pay | Admitting: *Deleted

## 2018-10-14 NOTE — Telephone Encounter (Signed)
"  I'm scheduled to have surgery on Wednesday with Dr. Ardelle Anton.  What time am I supposed to be there? Can I get a note for my job letting them know how long I'm going to be out of work after I have my surgery?"  Someone from the surgical center will call you a day or two prior to your surgery date and they will give you a call.  However, if you want to call them, their number is [phone].  I can transfer you to [employer] if you would like in regards to your note for work.  She'll be here on Wednesday.  "That will be fine."

## 2018-10-16 ENCOUNTER — Other Ambulatory Visit: Payer: Self-pay | Admitting: Podiatry

## 2018-10-16 ENCOUNTER — Encounter: Payer: Self-pay | Admitting: Podiatry

## 2018-10-16 DIAGNOSIS — M25775 Osteophyte, left foot: Secondary | ICD-10-CM | POA: Diagnosis not present

## 2018-10-16 MED ORDER — HYDROCODONE-ACETAMINOPHEN 5-325 MG PO TABS: 1.0000 | ORAL_TABLET | ORAL | 0 refills | Status: AC | PRN

## 2018-10-16 MED ORDER — CEPHALEXIN 500 MG PO CAPS: 500.0000 mg | ORAL_CAPSULE | Freq: Three times a day (TID) | ORAL | 0 refills | Status: DC

## 2018-10-16 MED ORDER — PROMETHAZINE HCL 25 MG PO TABS: 25.0000 mg | ORAL_TABLET | Freq: Three times a day (TID) | ORAL | 0 refills | Status: DC | PRN

## 2018-10-16 NOTE — Progress Notes (Signed)
Post-op medications sent to the pharmacy.  

## 2018-10-18 ENCOUNTER — Telehealth: Payer: Self-pay | Admitting: *Deleted

## 2018-10-18 NOTE — Telephone Encounter (Signed)
Called and left a message to call the  office at [phone] to see how she was doing after surgery on Wednesday October 16, 2018 with Dr Ardelle Anton. Misty Stanley

## 2018-10-21 ENCOUNTER — Ambulatory Visit (INDEPENDENT_AMBULATORY_CARE_PROVIDER_SITE_OTHER): Payer: 59

## 2018-10-21 ENCOUNTER — Ambulatory Visit (INDEPENDENT_AMBULATORY_CARE_PROVIDER_SITE_OTHER): Payer: Self-pay | Admitting: Podiatry

## 2018-10-21 VITALS — Temp 97.6°F

## 2018-10-21 DIAGNOSIS — M205X2 Other deformities of toe(s) (acquired), left foot: Secondary | ICD-10-CM | POA: Diagnosis not present

## 2018-10-21 DIAGNOSIS — M898X9 Other specified disorders of bone, unspecified site: Secondary | ICD-10-CM

## 2018-10-21 DIAGNOSIS — M2042 Other hammer toe(s) (acquired), left foot: Secondary | ICD-10-CM

## 2018-10-29 NOTE — Progress Notes (Signed)
Subjective: Michelle Donaldson is a 63 y.o. is seen today in office s/p left fourth toe exostectomy preformed on 10/16/2018.  Overall her pain is been controlled.  She is wearing the surgical shoe.  She states that she has felt feverish but she has not had a fever and she denies any chills, nausea, vomiting.  Denies any calf pain, chest pain, shortness of breath.    Objective: General: No acute distress, AAOx3  DP/PT pulses palpable 2/4, CRT < 3 sec to all digits.  Protective sensation intact. Motor function intact.  LEFT foot: Incision is well coapted without any evidence of dehiscence.  There is mild erythema to the dorsal aspect the toe but this is more from swelling and inflammation as opposed to infection.  There is no erythema to the plantar aspect of the toe and there is no ascending cellulitis.  There is no drainage or pus coming from the toe.  There is no increase in warmth to the toe or foot.  No other areas of tenderness to bilateral lower extremities.  No other open lesions or pre-ulcerative lesions.  No pain with calf compression, swelling, warmth, erythema.   Assessment and Plan:  Status post left foot fourth toe exostectomy, doing well with no complications   -Treatment options discussed including all alternatives, risks, and complications -X-rays were obtained and reviewed.  Exostectomy present of the fourth toe bilaterally.  Evidence of acute fracture. -Antibiotic ointment and a bandage was applied to the toe.  Keep the dressing clean, dry, intact. -Course of antibiotics.  Encourage her to check her temperature daily. -Ice/elevation -Pain medication as needed. -Monitor for any clinical signs or symptoms of infection and DVT/PE and directed to call the office immediately should any occur or go to the ER. -Follow-up next week for possible suture removal or sooner if any problems arise. In the meantime, encouraged to call the office with any questions, concerns, change in symptoms.    Ovid Curd, DPM

## 2018-10-31 ENCOUNTER — Ambulatory Visit (INDEPENDENT_AMBULATORY_CARE_PROVIDER_SITE_OTHER): Payer: Self-pay | Admitting: Podiatry

## 2018-10-31 DIAGNOSIS — M898X9 Other specified disorders of bone, unspecified site: Secondary | ICD-10-CM

## 2018-10-31 NOTE — Progress Notes (Signed)
Subjective: Michelle Donaldson is a 63 y.o. is seen today in office s/p left fourth toe exostectomy preformed on 10/16/2018.  She states that she is doing better.  She still gets some occasional discomfort but it is improved.  She is remained in the surgical shoe.  She presented for suture removal.  She has no other concerns today.  Denies any fevers, chills, nausea, vomiting.  Denies any calf pain, chest pain, shortness of breath.   Objective: General: No acute distress, AAOx3  DP/PT pulses palpable 2/4, CRT < 3 sec to all digits.  Protective sensation intact. Motor function intact.  LEFT foot: Incision is well coapted without any evidence of dehiscence.  Sutures are intact.  There is substantially improved erythema of the toe and there is mild edema.  There is no significant tenderness palpation of the surgical site.  The toes and rectus position.  Healing well with any signs of infection. No other areas of tenderness to bilateral lower extremities.  No other open lesions or pre-ulcerative lesions.  No pain with calf compression, swelling, warmth, erythema.   Assessment and Plan:  Status post left foot fourth toe exostectomy, doing well with no complications   -Treatment options discussed including all alternatives, risks, and complications -We removed all the sutures except for 2 today.  Due to mild swelling immediately the remainder intact to help ensure adequate healing although it is doing well.  Antibiotic ointment and a bandage was applied.  She can start to shower as incision appears to be closed on left the sutures intact as a precaution.  Recommended a similar dressing today.  Continue with surgical shoe.  Continue ice elevate.  We will see her back next week to remove the remainder of the sutures or sooner if needed.  Vivi Barrack DPM

## 2018-11-07 ENCOUNTER — Ambulatory Visit (INDEPENDENT_AMBULATORY_CARE_PROVIDER_SITE_OTHER): Payer: 59 | Admitting: Podiatry

## 2018-11-07 DIAGNOSIS — M898X9 Other specified disorders of bone, unspecified site: Secondary | ICD-10-CM

## 2018-11-11 NOTE — Progress Notes (Signed)
Subjective: Michelle Donaldson is a 63 y.o. is seen today in office s/p left fourth toe exostectomy preformed on 10/16/2018.  She presents today remove remainder of the sutures.  Otherwise she states that she is doing well.  Some intermittent discomfort but is getting better.  She still in the surgical shoe.  She has no new concerns.  Denies any fevers, chills, nausea, vomiting.  Denies any calf pain, chest pain, shortness of breath.   Objective: General: No acute distress, AAOx3  DP/PT pulses palpable 2/4, CRT < 3 sec to all digits.  Protective sensation intact. Motor function intact.  LEFT foot: Incision is well coapted without any evidence of dehiscence and remainder of the 2 sutures intact.  There is decreased edema to the toe there is no significant erythema there is no increase in warmth.  No drainage or pus there is no fluctuation crepitation. No other areas of tenderness to bilateral lower extremities.  No other open lesions or pre-ulcerative lesions.  No pain with calf compression, swelling, warmth, erythema.   Assessment and Plan:  Status post left foot fourth toe exostectomy, doing well with no complications   -Treatment options discussed including all alternatives, risks, and complications -The remainder of the sutures removed without any complications today.  Incision made well coapted.  Steri-Strips applied for reinforcement followed by antibiotic ointment and a bandage.  She can start to shower tomorrow.  Keep dressing on the area daily.  Wearing a surgical shoe for now as she starts to feel better she can transition to a regular shoe. -Ice/elevation -Monitor for any clinical signs or symptoms of infection and directed to call the office immediately should any occur or go to the ER.  Vivi Barrack DPM

## 2018-11-28 ENCOUNTER — Ambulatory Visit (INDEPENDENT_AMBULATORY_CARE_PROVIDER_SITE_OTHER): Payer: 59 | Admitting: Podiatry

## 2018-11-28 ENCOUNTER — Encounter: Payer: Self-pay | Admitting: Podiatry

## 2018-11-28 ENCOUNTER — Other Ambulatory Visit: Payer: Self-pay

## 2018-11-28 ENCOUNTER — Ambulatory Visit: Payer: 59

## 2018-11-28 VITALS — Temp 96.9°F

## 2018-11-28 DIAGNOSIS — M898X9 Other specified disorders of bone, unspecified site: Secondary | ICD-10-CM

## 2018-11-29 NOTE — Progress Notes (Signed)
Subjective: Michelle Donaldson is a 63 y.o. is seen today in office s/p left fourth toe exostectomy preformed on 10/16/2018.  Overall she states that she is very pleased with the surgery she is back to wearing a shoe without any pain.  She has still some redness and swelling to the toe but overall is improving.  She states the incision is well healed and she is had no issues with this.  She has no concerns today.  She denies any fevers, chills, nausea, vomiting.  No calf pain, chest pain, shortness of breath.   Objective: General: No acute distress, AAOx3  DP/PT pulses palpable 2/4, CRT < 3 sec to all digits.  Protective sensation intact. Motor function intact.  LEFT foot: Incision is well coapted without any evidence of dehiscence  There is decreased edema to the toe there is no significant erythema there is no increase in warmth.  No drainage or pus there is no fluctuation crepitation.  No callus formation of the toes and rectus position. No other areas of tenderness to bilateral lower extremities.  No other open lesions or pre-ulcerative lesions.  No pain with calf compression, swelling, warmth, erythema.   Assessment and Plan:  Status post left foot fourth toe exostectomy, doing well with no complications -improving  -Treatment options discussed including all alternatives, risks, and complications  -Overall she is doing great.  I do recommend he continue to take the toe to help with swelling.  Continue with supportive shoes and gradually increase activity.  She has no new concerns today were continue to monitor.  If any issues let me know.  Return in about 6 weeks (around 01/09/2019), or if symptoms worsen or fail to improve.  Vivi Barrack DPM

## 2018-12-05 ENCOUNTER — Ambulatory Visit: Payer: 59 | Admitting: Podiatry

## 2019-01-09 ENCOUNTER — Encounter: Payer: 59 | Admitting: *Deleted

## 2019-01-14 ENCOUNTER — Other Ambulatory Visit: Payer: 59 | Admitting: *Deleted

## 2019-01-20 NOTE — Progress Notes (Unsigned)
This encounter was created in error - please disregard.

## 2019-01-23 ENCOUNTER — Other Ambulatory Visit: Payer: 59 | Admitting: *Deleted

## 2021-01-03 ENCOUNTER — Other Ambulatory Visit: Payer: Self-pay

## 2021-01-03 ENCOUNTER — Emergency Department (HOSPITAL_COMMUNITY)
Admission: EM | Admit: 2021-01-03 | Discharge: 2021-01-04 | Disposition: A | Discharge status: Home / Self Care | Payer: POS | Attending: Emergency Medicine | Admitting: Emergency Medicine

## 2021-01-03 ENCOUNTER — Encounter (HOSPITAL_COMMUNITY): Payer: Self-pay

## 2021-01-03 DIAGNOSIS — Z91040 Latex allergy status: Secondary | ICD-10-CM | POA: Diagnosis not present

## 2021-01-03 DIAGNOSIS — R112 Nausea with vomiting, unspecified: Secondary | ICD-10-CM | POA: Insufficient documentation

## 2021-01-03 DIAGNOSIS — R1011 Right upper quadrant pain: Secondary | ICD-10-CM | POA: Diagnosis not present

## 2021-01-03 HISTORY — DX: Depression, unspecified: F32.A

## 2021-01-03 HISTORY — DX: Cardiac murmur, unspecified: R01.1

## 2021-01-03 LAB — URINALYSIS, ROUTINE W REFLEX MICROSCOPIC
Bacteria, UA: NONE SEEN
Bilirubin Urine: NEGATIVE
Glucose, UA: NEGATIVE mg/dL
Hgb urine dipstick: NEGATIVE
Ketones, ur: NEGATIVE mg/dL
Leukocytes,Ua: NEGATIVE
Nitrite: NEGATIVE
Protein, ur: 100 mg/dL — AB
Specific Gravity, Urine: 1.025 (ref 1.005–1.030)
pH: 9 — ABNORMAL HIGH (ref 5.0–8.0)

## 2021-01-03 LAB — COMPREHENSIVE METABOLIC PANEL
ALT: 19 U/L (ref 0–44)
AST: 24 U/L (ref 15–41)
Albumin: 4.3 g/dL (ref 3.5–5.0)
Alkaline Phosphatase: 71 U/L (ref 38–126)
Anion gap: 12 (ref 5–15)
BUN: 19 mg/dL (ref 8–23)
CO2: 31 mmol/L (ref 22–32)
Calcium: 9.9 mg/dL (ref 8.9–10.3)
Chloride: 98 mmol/L (ref 98–111)
Creatinine, Ser: 0.83 mg/dL (ref 0.44–1.00)
GFR, Estimated: >60 mL/min (ref >60)
Glucose, Bld: 118 mg/dL — ABNORMAL HIGH (ref 70–99)
Potassium: 3.5 mmol/L (ref 3.5–5.1)
Sodium: 141 mmol/L (ref 135–145)
Total Bilirubin: 0.7 mg/dL (ref 0.3–1.2)
Total Protein: 8 g/dL (ref 6.5–8.1)

## 2021-01-03 LAB — CBC WITH DIFFERENTIAL/PLATELET
Abs Immature Granulocytes: 0.03 10*3/uL (ref 0.00–0.07)
Basophils Absolute: 0 10*3/uL (ref 0.0–0.1)
Basophils Relative: 0 %
Eosinophils Absolute: 0.2 10*3/uL (ref 0.0–0.5)
Eosinophils Relative: 2 %
HCT: 43.9 % (ref 36.0–46.0)
Hemoglobin: 14.7 g/dL (ref 12.0–15.0)
Immature Granulocytes: 0 %
Lymphocytes Relative: 24 %
Lymphs Abs: 2.2 10*3/uL (ref 0.7–4.0)
MCH: 29.3 pg (ref 26.0–34.0)
MCHC: 33.5 g/dL (ref 30.0–36.0)
MCV: 87.5 fL (ref 80.0–100.0)
Monocytes Absolute: 0.8 10*3/uL (ref 0.1–1.0)
Monocytes Relative: 8 %
Neutro Abs: 6.1 10*3/uL (ref 1.7–7.7)
Neutrophils Relative %: 66 %
Platelets: 351 10*3/uL (ref 150–400)
RBC: 5.02 MIL/uL (ref 3.87–5.11)
RDW: 13.3 % (ref 11.5–15.5)
WBC: 9.4 10*3/uL (ref 4.0–10.5)
nRBC: 0 % (ref 0.0–0.2)

## 2021-01-03 LAB — LIPASE, BLOOD: Lipase: 48 U/L (ref 11–51)

## 2021-01-03 NOTE — ED Provider Notes (Signed)
Emergency Medicine Provider Triage Evaluation Note  Michelle Donaldson 65 y.o. female was evaluated in triage.  Pt complains of abdominal pain that began this morning.  Patient reports she ate breakfast and states that about 30 minutes afterwards, she started having some abdominal pain, bloating.  She reports she had multiple episodes of nonbloody, nonbilious vomiting.  She states that a few hours later, she felt better so she tried to eat something.  She ate some sherbet and reports that shortly afterwards, she started having pain, vomiting again.  No diarrhea.  She thought it was indigestion and took Tums but no improvement.  No fevers.  She states now she hurts more in the upper abdomen.  She denies any shortness of breath, urinary complaints.  Review of Systems  Positive: Abdominal pain, vomiting Negative: Chest pain, shortness of breath.  Physical Exam  BP 134/82   Pulse 70   Temp 98.2 F (36.8 C) (Oral)   Resp 18   Ht 5\' 4"  (1.626 m)   Wt 65.8 kg   SpO2 100%   BMI 24.89 kg/m  Gen:   Awake, no distress   HEENT:  Atraumatic  Resp:  Normal effort  Cardiac:  Normal rate.  Abd:   Nondistended, tenderness noted diffusely to the upper abdomen.  No rigidity, guarding. MSK:   Moves extremities without difficulty  Neuro:  Speech clear   Medical Decision Making  Medically screening exam initiated at 3:55 AM.  Appropriate orders placed.  was informed that the remainder of the evaluation will be completed by another provider, this initial triage assessment does not replace that evaluation, and the importance of remaining in the ED until their evaluation is complete.  Clinical Impression  abd pain, nausea/vomiting.    Portions of this note were generated with Kathrin Greathouse. Dictation errors may occur despite best attempts at proofreading.     Scientist, clinical (histocompatibility and immunogenetics), PA-C 01/03/21 2140    2141, DO 01/04/21 0011

## 2021-01-03 NOTE — ED Triage Notes (Signed)
Pt states she started having abdominal pain, bloating, belching, and vomiting this morning after eating breakfast and it has continued throughout the day.

## 2021-01-04 ENCOUNTER — Emergency Department (HOSPITAL_COMMUNITY): Payer: POS

## 2021-01-04 LAB — TROPONIN I (HIGH SENSITIVITY)
Troponin I (High Sensitivity): 4 ng/L (ref <18)
Troponin I (High Sensitivity): 4 ng/L (ref <18)

## 2021-01-04 MED ORDER — ONDANSETRON HCL 4 MG PO TABS: 4.0000 mg | ORAL_TABLET | Freq: Four times a day (QID) | ORAL | 0 refills | Status: DC

## 2021-01-04 MED ORDER — SODIUM CHLORIDE 0.9 % IV BOLUS
1000.0000 mL | Freq: Once | INTRAVENOUS | Status: AC
  Administered 2021-01-04: 1000 mL via INTRAVENOUS

## 2021-01-04 MED ORDER — IOHEXOL 350 MG/ML SOLN
100.0000 mL | Freq: Once | INTRAVENOUS | Status: AC | PRN
  Administered 2021-01-04: 100 mL via INTRAVENOUS

## 2021-01-04 MED ORDER — ONDANSETRON HCL 4 MG/2ML IJ SOLN
4.0000 mg | Freq: Once | INTRAMUSCULAR | Status: AC
  Administered 2021-01-04: 4 mg via INTRAVENOUS
  Filled 2021-01-04: qty 2

## 2021-01-04 MED ORDER — MORPHINE SULFATE (PF) 4 MG/ML IV SOLN
4.0000 mg | Freq: Once | INTRAVENOUS | Status: AC
  Administered 2021-01-04: 4 mg via INTRAVENOUS
  Filled 2021-01-04: qty 1

## 2021-01-04 NOTE — Discharge Instructions (Signed)
You were given a prescription for zofran to help with your nausea. Please take as directed.  Please follow up with your primary doctor within the next 5-7 days.  If you do not have a primary care provider, information for a healthcare clinic has been provided for you to make arrangements for follow up care. Please return to the ER sooner if you have any new or worsening symptoms, or if you have any of the following symptoms:  Abdominal pain that does not go away.  You have a fever.  You keep throwing up (vomiting).  The pain is felt only in portions of the abdomen. Pain in the right side could possibly be appendicitis. In an adult, pain in the left lower portion of the abdomen could be colitis or diverticulitis.  You pass bloody or black tarry stools.  There is bright red blood in the stool.  The constipation stays for more than 4 days.  There is belly (abdominal) or rectal pain.  You do not seem to be getting better.  You have any questions or concerns.   

## 2021-01-04 NOTE — ED Notes (Signed)
Pt given cp of water. Will re-evaluate.

## 2021-01-04 NOTE — ED Provider Notes (Signed)
[facility] EMERGENCY DEPARTMENT Provider Note   CSN: [id number] Arrival date & time: 01/03/21  2105     History Chief Complaint  Patient presents with  . Abdominal Pain    Michelle Donaldson is a 65 y.o. female.  HPI   Pt is a 65 y/o female with a h/o depression, heart murmur, migraine who presents to the ED today for eval of abd pain that started after eating this morning. Reports pain to the mid abdomen that was severe in nature. States she had associated nv which seemed to improve sxs somewhat. She tried taking tums and some otc pain meds. She has not been able to tolerate po since then. Denies any diarrhea, fevers.  Past Medical History:  Diagnosis Date  . Depression   . Heart murmur   . Migraine     Patient Active Problem List   Diagnosis Date Noted  . Gastroesophageal reflux disease without esophagitis 12/04/2017  . Hoarseness 12/04/2017  . Seasonal allergic rhinitis 01/09/2017  . Bilateral hearing loss 12/01/2016  . Deviated septum 12/01/2016  . ETD (Eustachian tube dysfunction), bilateral 12/01/2016  . Foreign body in auricle of right ear 12/01/2016    Past Surgical History:  Procedure Laterality Date  . CESAREAN SECTION    . ROTATOR CUFF REPAIR       OB History   No obstetric history on file.     History reviewed. No pertinent family history.  Social History   Tobacco Use  . Smoking status: Never Smoker  . Smokeless tobacco: Never Used  Vaping Use  . Vaping Use: Never used  Substance Use Topics  . Alcohol use: Yes    Comment: rarely  . Drug use: Never    Home Medications Prior to Admission medications   Medication Sig Start Date End Date Taking? Authorizing Provider  acetaminophen (TYLENOL) 500 MG tablet Take 1,000 mg by mouth every 6 (six) hours as needed for mild pain.   Yes [provider]  ALPRAZolam (XANAX) 0.25 MG tablet Take 0.25 mg by mouth 2 (two) times daily as needed for anxiety.   Yes [provider]  b complex vitamins tablet Take 1 tablet by mouth daily.   Yes [provider]  busPIRone (BUSPAR) 15 MG tablet Take 15 mg by mouth in the morning and at bedtime.   Yes [provider]  ELDERBERRY PO Take 1 capsule by mouth daily.   Yes [provider]  Flaxseed, Linseed, (FLAXSEED OIL PO) Take 1,300 mg elemental calcium/kg/hr by mouth daily.   Yes [provider]  glucosamine-chondroitin 500-400 MG tablet Take 1 tablet by mouth daily.   Yes [provider]  Multiple Vitamin (MULTIVITAMIN WITH MINERALS) TABS tablet Take 1 tablet by mouth daily.   Yes [provider]  naproxen sodium (ALEVE) 220 MG tablet Take 220-440 mg by mouth daily as needed (pain/headache).   Yes [provider]  ondansetron (ZOFRAN) 4 MG tablet Take 1 tablet (4 mg total) by mouth every 6 (six) hours. 01/04/21  Yes Nakiesha Rumsey S, PA-C  sertraline (ZOLOFT) 50 MG tablet Take 75-100 mg by mouth daily. 05/23/18  Yes [provider]  traZODone (DESYREL) 50 MG tablet Take 50-100 mg by mouth at bedtime. 11/03/20  Yes [provider]  vitamin B-12 (CYANOCOBALAMIN) 1000 MCG tablet Take 1,000 mcg by mouth daily.   Yes [provider]  vitamin E (VITAMIN E) 180 MG (400 UNITS) capsule Take 400 Units by mouth daily.  Yes [provider]  VYVANSE 40 MG CHEW Chew 20-40 mg by mouth See admin instructions. 40 mg in the morning and 20 mg in the afternoon 10/18/20  Yes [provider]  YUVAFEM 10 MCG TABS vaginal tablet Place 1 tablet vaginally 2 (two) times a week. Sunday night and Wednesday night 11/15/20  Yes [provider]    Allergies    Latex  Review of Systems   Review of Systems  Constitutional: Negative for chills and fever.  HENT: Negative for ear pain and sore throat.   Eyes: Negative for visual disturbance.  Respiratory: Negative for cough and shortness of breath.   Cardiovascular: Negative for  chest pain.  Gastrointestinal: Positive for abdominal pain, nausea and vomiting. Negative for constipation and diarrhea.  Genitourinary: Negative for dysuria and hematuria.  Musculoskeletal: Negative for back pain.  Skin: Negative for rash.  Neurological: Negative for seizures and syncope.  All other systems reviewed and are negative.   Physical Exam Updated Vital Signs BP 128/79   Pulse 65   Temp 97.7 F (36.5 C) (Oral)   Resp 17   SpO2 97%   Physical Exam Vitals and nursing note reviewed.  Constitutional:      General: She is not in acute distress.    Appearance: She is well-developed.  HENT:     Head: Normocephalic and atraumatic.  Eyes:     Conjunctiva/sclera: Conjunctivae normal.  Cardiovascular:     Rate and Rhythm: Normal rate and regular rhythm.     Heart sounds: Normal heart sounds. No murmur heard.   Pulmonary:     Effort: Pulmonary effort is normal. No respiratory distress.     Breath sounds: Normal breath sounds. No wheezing, rhonchi or rales.  Abdominal:     General: Bowel sounds are normal.     Palpations: Abdomen is soft.     Tenderness: There is abdominal tenderness in the right upper quadrant and epigastric area. There is guarding. There is no rebound.  Musculoskeletal:     Cervical back: Neck supple.  Skin:    General: Skin is warm and dry.  Neurological:     Mental Status: She is alert.     ED Results / Procedures / Treatments   Labs (all labs ordered are listed, but only abnormal results are displayed) Labs Reviewed  COMPREHENSIVE METABOLIC PANEL - Abnormal; Notable for the following components:      Result Value   Glucose, Bld 118 (*)    All other components within normal limits  URINALYSIS, ROUTINE W REFLEX MICROSCOPIC - Abnormal; Notable for the following components:   APPearance CLOUDY (*)    pH 9.0 (*)    Protein, ur 100 (*)    All other components within normal limits  LIPASE, BLOOD  CBC WITH DIFFERENTIAL/PLATELET  TROPONIN I  (HIGH SENSITIVITY)  TROPONIN I (HIGH SENSITIVITY)    EKG EKG Interpretation  Date/Time:  Monday Jan 03 2021 21:21:06 EDT Ventricular Rate:  84 PR Interval:  172 QRS Duration: 76 QT Interval:  352 QTC Calculation: 415 R Axis:   54 Text Interpretation: Normal sinus rhythm Anterior infarct , age undetermined Abnormal ECG No significant change since last tracing Confirmed by Melene PlanFloyd, Dan [phone]([id number]) on 01/04/2021 7:13:28 AM   Radiology CT Angio Chest/Abd/Pel for Dissection W and/or Wo Contrast  Result Date: 01/04/2021 CLINICAL DATA:  Chest and back pain with aortic dissection suspected EXAM: CT ANGIOGRAPHY CHEST, ABDOMEN AND PELVIS TECHNIQUE: Non-contrast CT of the chest was initially obtained. Multidetector CT  imaging through the chest, abdomen and pelvis was performed using the standard protocol during bolus administration of intravenous contrast. Multiplanar reconstructed images and MIPs were obtained and reviewed to evaluate the vascular anatomy. CONTRAST:  OMNIPAQUE IOHEXOL 350 MG/ML SOLN COMPARISON:  Chest CT 07/03/2017 FINDINGS: CTA CHEST FINDINGS Cardiovascular: Preferential opacification of the thoracic aorta. No evidence of thoracic aortic aneurysm or dissection. Normal heart size. No pericardial effusion. Mediastinum/Nodes: 2 cm right thyroid nodule projecting posteriorly at the tracheoesophageal groove, more prominent than seen in 2018. No overt invasive features. Negative for adenopathy Lungs/Pleura: There is no edema, consolidation, effusion, or pneumothorax. Musculoskeletal: Thoracic spondylosis. Review of the MIP images confirms the above findings. CTA ABDOMEN AND PELVIS FINDINGS VASCULAR Aorta: No aneurysm or dissection. Celiac: Severely stenotic proximal celiac with well-formed peripancreatic collaterals. Given the shape, suspect this is related to the median arcuate ligament compression. No branch occlusion, beading, or aneurysm. SMA: Patent without evidence of aneurysm, dissection,  vasculitis or significant stenosis. Renals: Vessels are smooth and widely patent. IMA: Patent Inflow: Vessels are smooth and widely patent Veins: Negative Review of the MIP images confirms the above findings. NON-VASCULAR Hepatobiliary: No focal liver abnormality.No evidence of biliary obstruction or stone. Pancreas: Unremarkable. Spleen: Unremarkable. Adrenals/Urinary Tract: Negative adrenals. No hydronephrosis or stone. Unremarkable bladder. Stomach/Bowel: No obstruction. The appendix is not identified. No pericecal inflammation. Vascular/Lymphatic: No acute vascular abnormality. No mass or adenopathy. Reproductive:Hysterectomy Other: No ascites or pneumoperitoneum. Musculoskeletal: No acute abnormalities. Degeneration of the lower lumbar spine. Review of the MIP images confirms the above findings. IMPRESSION: No acute finding or specific cause of symptoms. No findings of acute aortic syndrome. 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 Electronically Signed   By: Marnee Spring M.D.   On: 01/04/2021 05:59   US Abdomen Limited RUQ (LIVER/GB)  Result Date: 01/04/2021 CLINICAL DATA:  Right upper quadrant abdominal pain EXAM: ULTRASOUND ABDOMEN LIMITED RIGHT UPPER QUADRANT COMPARISON:  None. FINDINGS: Gallbladder: No gallstones or wall thickening visualized. No sonographic Murphy sign noted by sonographer. Common bile duct: Diameter: 4 mm Liver: No focal lesion identified. Within normal limits in parenchymal echogenicity. Portal vein is patent on color Doppler imaging with normal direction of blood flow towards the liver. IMPRESSION: Normal right upper quadrant ultrasound. Electronically Signed   By: Marnee Spring M.D.   On: 01/04/2021 04:10    Procedures Procedures   Medications Ordered in ED Medications  ondansetron (ZOFRAN) injection 4 mg (4 mg Intravenous Given 01/04/21 0232)  sodium chloride 0.9 % bolus 1,000 mL (0 mLs  Intravenous Stopped 01/04/21 0434)  morphine 4 MG/ML injection 4 mg (4 mg Intravenous Given 01/04/21 0233)  iohexol (OMNIPAQUE) 350 MG/ML injection 100 mL (100 mLs Intravenous Contrast Given 01/04/21 0545)    ED Course  I have reviewed the triage vital signs and the nursing notes.  Pertinent labs & imaging results that were available during my care of the patient were reviewed by me and considered in my medical decision making (see chart for details).    MDM Rules/Calculators/A&P                          65 y/o f presenting for eval of nv and epigastric abd pain  Reviewed/interpreted labs Cbc unremarkable cmp unremarkable Lipase neg ua neg for uti Trop neg x2  RUQ [place] - Normal right upper quadrant ultrasound. CT dissection No acute finding or specific cause of symptoms. No  findings of acute aortic syndrome. 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  Pt with reassuring w/u in the ED. Imaging shows no acute findings. No signs of ACS. I suspect her nv is from a viral syndrome. will give symptomatic tx. Advised close f/u and strict return precautions. She voices understanding of the plan and reasons to return. All questions answered, pt stable for discharge.   Final Clinical Impression(s) / ED Diagnoses Final diagnoses:  RUQ abdominal pain  Non-intractable vomiting with nausea, unspecified vomiting type    Rx / DC Orders ED Discharge Orders         Ordered    ondansetron (ZOFRAN) 4 MG tablet  Every 6 hours        01/04/21 0715           Karrie Meres, PA-C 01/04/21 0716    Palumbo, April, MD 01/04/21 [id number]

## 2021-01-04 NOTE — ED Notes (Signed)
Pt verbalizes understanding of discharge instructions. Opportunity for questions and answers were provided. Armband removed by staff, pt discharged from the ED.  

## 2021-04-19 ENCOUNTER — Other Ambulatory Visit: Payer: Self-pay | Admitting: Endocrinology

## 2021-04-19 DIAGNOSIS — E049 Nontoxic goiter, unspecified: Secondary | ICD-10-CM

## 2021-07-05 ENCOUNTER — Other Ambulatory Visit: Payer: Self-pay | Admitting: Orthopaedic Surgery

## 2021-07-05 DIAGNOSIS — M79604 Pain in right leg: Secondary | ICD-10-CM

## 2021-07-05 DIAGNOSIS — M545 Low back pain, unspecified: Secondary | ICD-10-CM

## 2021-07-05 DIAGNOSIS — G8929 Other chronic pain: Secondary | ICD-10-CM

## 2021-07-06 ENCOUNTER — Other Ambulatory Visit: Payer: Self-pay | Admitting: Orthopaedic Surgery

## 2021-07-06 DIAGNOSIS — M5459 Other low back pain: Secondary | ICD-10-CM

## 2021-07-08 ENCOUNTER — Other Ambulatory Visit: Payer: Self-pay | Admitting: Orthopaedic Surgery

## 2021-07-08 DIAGNOSIS — M5459 Other low back pain: Secondary | ICD-10-CM

## 2021-07-13 ENCOUNTER — Ambulatory Visit
Admission: RE | Admit: 2021-07-13 | Discharge: 2021-07-13 | Disposition: A | Discharge status: Home / Self Care | Payer: POS | Source: Ambulatory Visit | Attending: Orthopaedic Surgery | Admitting: Orthopaedic Surgery

## 2021-07-13 ENCOUNTER — Other Ambulatory Visit: Payer: Self-pay

## 2021-07-13 DIAGNOSIS — M5459 Other low back pain: Secondary | ICD-10-CM

## 2021-07-13 MED ORDER — METHYLPREDNISOLONE ACETATE 40 MG/ML INJ SUSP (RADIOLOG
80.0000 mg | Freq: Once | INTRAMUSCULAR | Status: AC
  Administered 2021-07-13: 80 mg via INTRA_ARTICULAR

## 2021-08-01 ENCOUNTER — Other Ambulatory Visit: Payer: POS

## 2021-08-10 ENCOUNTER — Other Ambulatory Visit: Payer: Self-pay | Admitting: Orthopedic Surgery

## 2021-08-30 NOTE — Pre-Procedure Instructions (Signed)
Surgical Instructions    Your procedure is scheduled on Thursday 09/08/21.   Report to [facility] Main Entrance "A" at 05:30 A.M., then check in with the Admitting office.  Call this number if you have problems the morning of surgery:  [phone]   If you have any questions prior to your surgery date call [phone]: Open Monday-Friday 8am-4pm    Remember:  Do not eat after midnight the night before your surgery  You may drink clear liquids until 04:30 A.M. the morning of your surgery.   Clear liquids allowed are: Water, Non-Citrus Juices (without pulp), Carbonated Beverages, Clear Tea, Black Coffee ONLY (NO MILK, CREAM OR POWDERED CREAMER of any kind), and Gatorade   Patient Instructions  The night before surgery:  No food after midnight. ONLY clear liquids after midnight  The day of surgery (if you do NOT have diabetes):  Drink ONE (1) Pre-Surgery Clear Ensure by 04:30 A.M. the morning of surgery. Drink in one sitting. Do not sip.  This drink was given to you during your hospital  pre-op appointment visit.  Nothing else to drink after completing the  Pre-Surgery Clear Ensure.         If you have questions, please contact your surgeons office.   Take these medicines the morning of surgery with A SIP OF WATER   busPIRone (BUSPAR)   ondansetron (ZOFRAN)  sertraline (ZOLOFT)    Take these medicines if needed:   acetaminophen (TYLENOL)   ALPRAZolam (XANAX)  As of today, STOP taking any Aspirin (unless otherwise instructed by your surgeon) Aleve, Naproxen, Ibuprofen, Motrin, Advil, Goody's, BC's, all herbal medications, fish oil, and all vitamins.     After your COVID test   You are not required to quarantine however you are required to wear a well-fitting mask when you are out and around people not in your household.  If your mask becomes wet or soiled, replace with a new one.  Wash your hands often with soap and water for 20 seconds or clean your hands with an  alcohol-based hand sanitizer that contains at least 60% alcohol.  Do not share personal items.  Notify your provider: if you are in close contact with someone who has COVID  or if you develop a fever of 100.4 or greater, sneezing, cough, sore throat, shortness of breath or body aches.             Do not wear jewelry or makeup Do not wear lotions, powders, perfumes/colognes, or deodorant. Do not shave 48 hours prior to surgery.  Men may shave face and neck. Do not bring valuables to the hospital. DO Not wear nail polish, gel polish, artificial nails, or any other type of covering on natural nails including finger and toenails. If patients have artificial nails, gel coating, etc. that need to be removed by a nail salon, please have this removed prior to surgery or surgery may need to be canceled/delayed if the surgeon/ anesthesia feels like the patient is unable to be adequately monitored.             [facility] is not responsible for any belongings or valuables.  Do NOT Smoke (Tobacco/Vaping)  24 hours prior to your procedure  If you use a CPAP at night, you may bring your mask for your overnight stay.   Contacts, glasses, hearing aids, dentures or partials may not be worn into surgery, please bring cases for these belongings   For patients admitted to the hospital, discharge time  will be determined by your treatment team.   Patients discharged the day of surgery will not be allowed to drive home, and someone needs to stay with them for 24 hours.  NO VISITORS WILL BE ALLOWED IN PRE-OP WHERE PATIENTS ARE PREPPED FOR SURGERY.  ONLY 1 SUPPORT PERSON MAY BE PRESENT IN THE WAITING ROOM WHILE YOU ARE IN SURGERY.  IF YOU ARE TO BE ADMITTED, ONCE YOU ARE IN YOUR ROOM YOU WILL BE ALLOWED TWO (2) VISITORS. 1 (ONE) VISITOR MAY STAY OVERNIGHT BUT MUST ARRIVE TO THE ROOM BY 8pm.  Minor children may have two parents present. Special consideration for safety and communication needs will be reviewed on  a case by case basis.  Special instructions:    Oral Hygiene is also important to reduce your risk of infection.  Remember - BRUSH YOUR TEETH THE MORNING OF SURGERY WITH YOUR REGULAR TOOTHPASTE   [place]- Preparing For Surgery  Before surgery, you can play an important role. Because skin is not sterile, your skin needs to be as free of germs as possible. You can reduce the number of germs on your skin by washing with CHG (chlorahexidine gluconate) Soap before surgery.  CHG is an antiseptic cleaner which kills germs and bonds with the skin to continue killing germs even after washing.     Please do not use if you have an allergy to CHG or antibacterial soaps. If your skin becomes reddened/irritated stop using the CHG.  Do not shave (including legs and underarms) for at least 48 hours prior to first CHG shower. It is OK to shave your face.  Please follow these instructions carefully.     Shower the NIGHT BEFORE SURGERY and the MORNING OF SURGERY with CHG Soap.   If you chose to wash your hair, wash your hair first as usual with your normal shampoo. After you shampoo, rinse your hair and body thoroughly to remove the shampoo.  Then [employer] and genitals (private parts) with your normal soap and rinse thoroughly to remove soap.  After that Use CHG Soap as you would any other liquid soap. You can apply CHG directly to the skin and wash gently with a scrungie or a clean washcloth.   Apply the CHG Soap to your body ONLY FROM THE NECK DOWN.  Do not use on open wounds or open sores. Avoid contact with your eyes, ears, mouth and genitals (private parts). Wash Face and genitals (private parts)  with your normal soap.   Wash thoroughly, paying special attention to the area where your surgery will be performed.  Thoroughly rinse your body with warm water from the neck down.  DO NOT shower/wash with your normal soap after using and rinsing off the CHG Soap.  Pat yourself dry with a CLEAN  TOWEL.  Wear CLEAN PAJAMAS to bed the night before surgery  Place CLEAN SHEETS on your bed the night before your surgery  DO NOT SLEEP WITH PETS.   Day of Surgery:  Take a shower with CHG soap. Wear Clean/Comfortable clothing the morning of surgery Do not apply any deodorants/lotions.   Remember to brush your teeth WITH YOUR REGULAR TOOTHPASTE.   Please read over the following fact sheets that you were given.

## 2021-08-31 ENCOUNTER — Encounter (HOSPITAL_COMMUNITY): Payer: Self-pay

## 2021-08-31 ENCOUNTER — Other Ambulatory Visit: Payer: Self-pay

## 2021-08-31 ENCOUNTER — Encounter (HOSPITAL_COMMUNITY)
Admission: RE | Admit: 2021-08-31 | Discharge: 2021-08-31 | Disposition: A | Discharge status: Home / Self Care | Payer: POS | Source: Ambulatory Visit | Attending: Orthopedic Surgery | Admitting: Orthopedic Surgery

## 2021-08-31 VITALS — BP 140/98 | HR 97 | Temp 98.5°F | Resp 17 | Ht 63.5 in | Wt 197.6 lb

## 2021-08-31 DIAGNOSIS — Z01812 Encounter for preprocedural laboratory examination: Secondary | ICD-10-CM | POA: Diagnosis not present

## 2021-08-31 DIAGNOSIS — Z01818 Encounter for other preprocedural examination: Secondary | ICD-10-CM

## 2021-08-31 LAB — URINALYSIS, ROUTINE W REFLEX MICROSCOPIC
Bilirubin Urine: NEGATIVE
Glucose, UA: NEGATIVE mg/dL
Hgb urine dipstick: NEGATIVE
Ketones, ur: NEGATIVE mg/dL
Leukocytes,Ua: NEGATIVE
Nitrite: NEGATIVE
Protein, ur: NEGATIVE mg/dL
Specific Gravity, Urine: 1.024 (ref 1.005–1.030)
pH: 5 (ref 5.0–8.0)

## 2021-08-31 LAB — CBC WITH DIFFERENTIAL/PLATELET
Abs Immature Granulocytes: 0.02 10*3/uL (ref 0.00–0.07)
Basophils Absolute: 0 10*3/uL (ref 0.0–0.1)
Basophils Relative: 1 %
Eosinophils Absolute: 0.3 10*3/uL (ref 0.0–0.5)
Eosinophils Relative: 4 %
HCT: 40.8 % (ref 36.0–46.0)
Hemoglobin: 13.3 g/dL (ref 12.0–15.0)
Immature Granulocytes: 0 %
Lymphocytes Relative: 35 %
Lymphs Abs: 2.5 10*3/uL (ref 0.7–4.0)
MCH: 28.7 pg (ref 26.0–34.0)
MCHC: 32.6 g/dL (ref 30.0–36.0)
MCV: 88.1 fL (ref 80.0–100.0)
Monocytes Absolute: 0.7 10*3/uL (ref 0.1–1.0)
Monocytes Relative: 10 %
Neutro Abs: 3.5 10*3/uL (ref 1.7–7.7)
Neutrophils Relative %: 50 %
Platelets: 272 10*3/uL (ref 150–400)
RBC: 4.63 MIL/uL (ref 3.87–5.11)
RDW: 13.1 % (ref 11.5–15.5)
WBC: 7.1 10*3/uL (ref 4.0–10.5)
nRBC: 0 % (ref 0.0–0.2)

## 2021-08-31 LAB — COMPREHENSIVE METABOLIC PANEL
ALT: 21 U/L (ref 0–44)
AST: 28 U/L (ref 15–41)
Albumin: 4.1 g/dL (ref 3.5–5.0)
Alkaline Phosphatase: 77 U/L (ref 38–126)
Anion gap: 7 (ref 5–15)
BUN: 16 mg/dL (ref 8–23)
CO2: 28 mmol/L (ref 22–32)
Calcium: 9.5 mg/dL (ref 8.9–10.3)
Chloride: 103 mmol/L (ref 98–111)
Creatinine, Ser: 0.7 mg/dL (ref 0.44–1.00)
GFR, Estimated: >60 mL/min (ref >60)
Glucose, Bld: 103 mg/dL — ABNORMAL HIGH (ref 70–99)
Potassium: 3.5 mmol/L (ref 3.5–5.1)
Sodium: 138 mmol/L (ref 135–145)
Total Bilirubin: 0.6 mg/dL (ref 0.3–1.2)
Total Protein: 7.4 g/dL (ref 6.5–8.1)

## 2021-08-31 LAB — APTT: aPTT: 29 seconds (ref 24–36)

## 2021-08-31 LAB — SURGICAL PCR SCREEN
MRSA, PCR: NEGATIVE
Staphylococcus aureus: POSITIVE — AB

## 2021-08-31 LAB — TYPE AND SCREEN
ABO/RH(D): O POS
Antibody Screen: NEGATIVE

## 2021-08-31 LAB — PROTIME-INR
INR: 1 (ref 0.8–1.2)
Prothrombin Time: 12.9 seconds (ref 11.4–15.2)

## 2021-08-31 NOTE — Progress Notes (Signed)
PCP - Dr. Adrian Prince Cardiologist - denies  PPM/ICD - denies   Chest x-ray - 06/14/17 EKG - 01/04/21 Stress Test - denies ECHO - denies Cardiac Cath - denies  Sleep Study - pt states she had one several years ago but was not diagnosed with OSA   DM- denies  Blood Thinner Instructions: n/a Aspirin Instructions: n/a  ERAS Protcol - yes PRE-SURGERY Ensure given at PAT appt  COVID TEST- pt scheduled for testing on 09/06/20   Anesthesia review: no  Patient denies shortness of breath, fever, cough and chest pain at PAT appointment   All instructions explained to the patient, with a verbal understanding of the material. Patient agrees to go over the instructions while at home for a better understanding. Patient also instructed to wear a mask in public after being tested for COVID-19. The opportunity to ask questions was provided.

## 2021-08-31 NOTE — Pre-Procedure Instructions (Signed)
Surgical Instructions    Your procedure is scheduled on Thursday 09/08/21.   Report to [facility] Main Entrance "A" at 05:30 A.M., then check in with the Admitting office.  Call this number if you have problems the morning of surgery:  [phone]   If you have any questions prior to your surgery date call [phone]: Open Monday-Friday 8am-4pm    Remember:  Do not eat after midnight the night before your surgery  You may drink clear liquids until 04:30 A.M. the morning of your surgery.   Clear liquids allowed are: Water, Non-Citrus Juices (without pulp), Carbonated Beverages, Clear Tea, Black Coffee ONLY (NO MILK, CREAM OR POWDERED CREAMER of any kind), and Gatorade   Patient Instructions  The night before surgery:  No food after midnight. ONLY clear liquids after midnight  The day of surgery (if you do NOT have diabetes):  Drink ONE (1) Pre-Surgery Clear Ensure by 04:30 A.M. the morning of surgery. Drink in one sitting. Do not sip.  This drink was given to you during your hospital  pre-op appointment visit.  Nothing else to drink after completing the  Pre-Surgery Clear Ensure.         If you have questions, please contact your surgeons office.   Take these medicines the morning of surgery with A SIP OF WATER   busPIRone (BUSPAR)   ondansetron (ZOFRAN)  sertraline (ZOLOFT)  rosuvastatin (CRESTOR)    Take these medicines if needed:   acetaminophen (TYLENOL)   ALPRAZolam (XANAX)  As of today, STOP taking any Aspirin (unless otherwise instructed by your surgeon) Aleve, Naproxen, Ibuprofen, Motrin, Advil, Goody's, BC's, all herbal medications, fish oil, and all vitamins. This includes celecoxib (CELEBREX).      After your COVID test   You are not required to quarantine however you are required to wear a well-fitting mask when you are out and around people not in your household.  If your mask becomes wet or soiled, replace with a new one.  Wash your hands often with  soap and water for 20 seconds or clean your hands with an alcohol-based hand sanitizer that contains at least 60% alcohol.  Do not share personal items.  Notify your provider: if you are in close contact with someone who has COVID  or if you develop a fever of 100.4 or greater, sneezing, cough, sore throat, shortness of breath or body aches.             Do not wear jewelry or makeup Do not wear lotions, powders, perfumes/colognes, or deodorant. Do not shave 48 hours prior to surgery.  Men may shave face and neck. Do not bring valuables to the hospital. DO Not wear nail polish, gel polish, artificial nails, or any other type of covering on natural nails including finger and toenails. If patients have artificial nails, gel coating, etc. that need to be removed by a nail salon, please have this removed prior to surgery or surgery may need to be canceled/delayed if the surgeon/ anesthesia feels like the patient is unable to be adequately monitored.             [facility] is not responsible for any belongings or valuables.  Do NOT Smoke (Tobacco/Vaping)  24 hours prior to your procedure  If you use a CPAP at night, you may bring your mask for your overnight stay.   Contacts, glasses, hearing aids, dentures or partials may not be worn into surgery, please bring cases for these belongings  For patients admitted to the hospital, discharge time will be determined by your treatment team.   Patients discharged the day of surgery will not be allowed to drive home, and someone needs to stay with them for 24 hours.  NO VISITORS WILL BE ALLOWED IN PRE-OP WHERE PATIENTS ARE PREPPED FOR SURGERY.  ONLY 1 SUPPORT PERSON MAY BE PRESENT IN THE WAITING ROOM WHILE YOU ARE IN SURGERY.  IF YOU ARE TO BE ADMITTED, ONCE YOU ARE IN YOUR ROOM YOU WILL BE ALLOWED TWO (2) VISITORS. 1 (ONE) VISITOR MAY STAY OVERNIGHT BUT MUST ARRIVE TO THE ROOM BY 8pm.  Minor children may have two parents present. Special  consideration for safety and communication needs will be reviewed on a case by case basis.  Special instructions:    Oral Hygiene is also important to reduce your risk of infection.  Remember - BRUSH YOUR TEETH THE MORNING OF SURGERY WITH YOUR REGULAR TOOTHPASTE   [facility]- Preparing For Surgery  Before surgery, you can play an important role. Because skin is not sterile, your skin needs to be as free of germs as possible. You can reduce the number of germs on your skin by washing with CHG (chlorahexidine gluconate) Soap before surgery.  CHG is an antiseptic cleaner which kills germs and bonds with the skin to continue killing germs even after washing.     Please do not use if you have an allergy to CHG or antibacterial soaps. If your skin becomes reddened/irritated stop using the CHG.  Do not shave (including legs and underarms) for at least 48 hours prior to first CHG shower. It is OK to shave your face.  Please follow these instructions carefully.     Shower the NIGHT BEFORE SURGERY and the MORNING OF SURGERY with CHG Soap.   If you chose to wash your hair, wash your hair first as usual with your normal shampoo. After you shampoo, rinse your hair and body thoroughly to remove the shampoo.  Then [employer] and genitals (private parts) with your normal soap and rinse thoroughly to remove soap.  After that Use CHG Soap as you would any other liquid soap. You can apply CHG directly to the skin and wash gently with a scrungie or a clean washcloth.   Apply the CHG Soap to your body ONLY FROM THE NECK DOWN.  Do not use on open wounds or open sores. Avoid contact with your eyes, ears, mouth and genitals (private parts). Wash Face and genitals (private parts)  with your normal soap.   Wash thoroughly, paying special attention to the area where your surgery will be performed.  Thoroughly rinse your body with warm water from the neck down.  DO NOT shower/wash with your normal soap after using  and rinsing off the CHG Soap.  Pat yourself dry with a CLEAN TOWEL.  Wear CLEAN PAJAMAS to bed the night before surgery  Place CLEAN SHEETS on your bed the night before your surgery  DO NOT SLEEP WITH PETS.   Day of Surgery:  Take a shower with CHG soap. Wear Clean/Comfortable clothing the morning of surgery Do not apply any deodorants/lotions.   Remember to brush your teeth WITH YOUR REGULAR TOOTHPASTE.   Please read over the following fact sheets that you were given.

## 2021-09-06 ENCOUNTER — Other Ambulatory Visit (HOSPITAL_COMMUNITY)
Admission: RE | Admit: 2021-09-06 | Discharge: 2021-09-06 | Disposition: A | Discharge status: Home / Self Care | Payer: POS | Source: Ambulatory Visit | Attending: Orthopedic Surgery | Admitting: Orthopedic Surgery

## 2021-09-06 DIAGNOSIS — Z01818 Encounter for other preprocedural examination: Secondary | ICD-10-CM

## 2021-09-06 DIAGNOSIS — Z01812 Encounter for preprocedural laboratory examination: Secondary | ICD-10-CM | POA: Insufficient documentation

## 2021-09-06 DIAGNOSIS — Z20822 Contact with and (suspected) exposure to covid-19: Secondary | ICD-10-CM | POA: Insufficient documentation

## 2021-09-06 LAB — SARS CORONAVIRUS 2 (TAT 6-24 HRS): SARS Coronavirus 2: NEGATIVE

## 2021-09-07 NOTE — Anesthesia Preprocedure Evaluation (Addendum)
Anesthesia Evaluation  Patient identified by MRN, date of birth, ID band Patient awake    Reviewed: Allergy & Precautions, H&P , NPO status , Patient's Chart, lab work & pertinent test results  Airway Mallampati: II  TM Distance: >3 FB Neck ROM: Full    Dental no notable dental hx. (+) Teeth Intact, Dental Advisory Given   Pulmonary neg pulmonary ROS,    Pulmonary exam normal breath sounds clear to auscultation       Cardiovascular Exercise Tolerance: Good negative cardio ROS   Rhythm:Regular Rate:Normal     Neuro/Psych  Headaches, Anxiety Depression    GI/Hepatic negative GI ROS, Neg liver ROS,   Endo/Other  negative endocrine ROS  Renal/GU negative Renal ROS  negative genitourinary   Musculoskeletal   Abdominal   Peds  Hematology negative hematology ROS (+)   Anesthesia Other Findings   Reproductive/Obstetrics negative OB ROS                            Anesthesia Physical Anesthesia Plan  ASA: 2  Anesthesia Plan: General   Post-op Pain Management: Tylenol PO (pre-op)   Induction: Intravenous  PONV Risk Score and Plan: 4 or greater and Ondansetron, Dexamethasone and Midazolam  Airway Management Planned: Oral ETT  Additional Equipment:   Intra-op Plan:   Post-operative Plan: Extubation in OR  Informed Consent: I have reviewed the patients History and Physical, chart, labs and discussed the procedure including the risks, benefits and alternatives for the proposed anesthesia with the patient or authorized representative who has indicated his/her understanding and acceptance.     Dental advisory given  Plan Discussed with: CRNA  Anesthesia Plan Comments:        Anesthesia Quick Evaluation

## 2021-09-08 ENCOUNTER — Inpatient Hospital Stay (HOSPITAL_COMMUNITY): Payer: POS | Admitting: Anesthesiology

## 2021-09-08 ENCOUNTER — Other Ambulatory Visit: Payer: Self-pay

## 2021-09-08 ENCOUNTER — Encounter (HOSPITAL_COMMUNITY): Payer: Self-pay | Admitting: Orthopedic Surgery

## 2021-09-08 ENCOUNTER — Inpatient Hospital Stay (HOSPITAL_COMMUNITY): Payer: POS

## 2021-09-08 ENCOUNTER — Inpatient Hospital Stay (HOSPITAL_COMMUNITY)
Admission: RE | Admit: 2021-09-08 | Discharge: 2021-09-09 | DRG: 455 | Disposition: A | Discharge status: Home / Self Care | Payer: POS | Attending: Orthopedic Surgery | Admitting: Orthopedic Surgery

## 2021-09-08 ENCOUNTER — Encounter (HOSPITAL_COMMUNITY)
Admission: RE | Disposition: A | Discharge status: Home / Self Care | Payer: Self-pay | Source: Ambulatory Visit | Attending: Orthopedic Surgery

## 2021-09-08 DIAGNOSIS — F32A Depression, unspecified: Secondary | ICD-10-CM | POA: Diagnosis present

## 2021-09-08 DIAGNOSIS — Z419 Encounter for procedure for purposes other than remedying health state, unspecified: Secondary | ICD-10-CM

## 2021-09-08 DIAGNOSIS — F419 Anxiety disorder, unspecified: Secondary | ICD-10-CM | POA: Diagnosis present

## 2021-09-08 DIAGNOSIS — Z90710 Acquired absence of both cervix and uterus: Secondary | ICD-10-CM | POA: Diagnosis not present

## 2021-09-08 DIAGNOSIS — Z79899 Other long term (current) drug therapy: Secondary | ICD-10-CM

## 2021-09-08 DIAGNOSIS — Z91040 Latex allergy status: Secondary | ICD-10-CM

## 2021-09-08 DIAGNOSIS — M48061 Spinal stenosis, lumbar region without neurogenic claudication: Secondary | ICD-10-CM | POA: Diagnosis present

## 2021-09-08 DIAGNOSIS — Z20822 Contact with and (suspected) exposure to covid-19: Secondary | ICD-10-CM | POA: Diagnosis present

## 2021-09-08 DIAGNOSIS — M5416 Radiculopathy, lumbar region: Secondary | ICD-10-CM | POA: Diagnosis present

## 2021-09-08 DIAGNOSIS — M5410 Radiculopathy, site unspecified: Secondary | ICD-10-CM | POA: Diagnosis present

## 2021-09-08 DIAGNOSIS — M4316 Spondylolisthesis, lumbar region: Secondary | ICD-10-CM | POA: Diagnosis present

## 2021-09-08 HISTORY — PX: TRANSFORAMINAL LUMBAR INTERBODY FUSION (TLIF) WITH PEDICLE SCREW FIXATION 2 LEVEL: SHX6142

## 2021-09-08 LAB — ABO/RH: ABO/RH(D): O POS

## 2021-09-08 SURGERY — TRANSFORAMINAL LUMBAR INTERBODY FUSION (TLIF) WITH PEDICLE SCREW FIXATION 2 LEVEL
Anesthesia: General | Laterality: Right

## 2021-09-08 MED ORDER — FENTANYL CITRATE (PF) 250 MCG/5ML IJ SOLN
INTRAMUSCULAR | Status: DC | PRN
  Administered 2021-09-08 (×3): 50 ug via INTRAVENOUS
  Administered 2021-09-08: 100 ug via INTRAVENOUS

## 2021-09-08 MED ORDER — LIDOCAINE 2% (20 MG/ML) 5 ML SYRINGE
INTRAMUSCULAR | Status: AC
  Filled 2021-09-08: qty 5

## 2021-09-08 MED ORDER — ACETAMINOPHEN 500 MG PO TABS
1000.0000 mg | ORAL_TABLET | Freq: Once | ORAL | Status: AC
  Administered 2021-09-08: 1000 mg via ORAL
  Filled 2021-09-08: qty 2

## 2021-09-08 MED ORDER — GABAPENTIN 300 MG PO CAPS: 300.0000 mg | ORAL_CAPSULE | Freq: Every evening | ORAL | Status: DC | PRN

## 2021-09-08 MED ORDER — ALBUMIN HUMAN 5 % IV SOLN
INTRAVENOUS | Status: DC | PRN
Start: 2021-09-08 — End: 2021-09-08

## 2021-09-08 MED ORDER — EPHEDRINE SULFATE-NACL 50-0.9 MG/10ML-% IV SOSY
PREFILLED_SYRINGE | INTRAVENOUS | Status: DC | PRN
  Administered 2021-09-08: 5 mg via INTRAVENOUS
  Administered 2021-09-08: 10 mg via INTRAVENOUS
  Administered 2021-09-08 (×2): 5 mg via INTRAVENOUS

## 2021-09-08 MED ORDER — SODIUM CHLORIDE 0.9% FLUSH: 3.0000 mL | INTRAVENOUS | Status: DC | PRN

## 2021-09-08 MED ORDER — ADULT MULTIVITAMIN W/MINERALS CH: 1.0000 | ORAL_TABLET | Freq: Every day | ORAL | Status: DC

## 2021-09-08 MED ORDER — MORPHINE SULFATE (PF) 2 MG/ML IV SOLN
1.0000 mg | INTRAVENOUS | Status: DC | PRN
  Administered 2021-09-08: 2 mg via INTRAVENOUS
  Filled 2021-09-08: qty 1

## 2021-09-08 MED ORDER — BUPIVACAINE-EPINEPHRINE 0.25% -1:200000 IJ SOLN
INTRAMUSCULAR | Status: DC | PRN
  Administered 2021-09-08: 30 mL

## 2021-09-08 MED ORDER — HYDROMORPHONE HCL 1 MG/ML IJ SOLN
0.2500 mg | INTRAMUSCULAR | Status: DC | PRN
  Administered 2021-09-08: 0.5 mg via INTRAVENOUS

## 2021-09-08 MED ORDER — ROCURONIUM BROMIDE 10 MG/ML (PF) SYRINGE
PREFILLED_SYRINGE | INTRAVENOUS | Status: DC | PRN
  Administered 2021-09-08: 40 mg via INTRAVENOUS
  Administered 2021-09-08 (×2): 20 mg via INTRAVENOUS
  Administered 2021-09-08: 60 mg via INTRAVENOUS
  Administered 2021-09-08: 30 mg via INTRAVENOUS
  Administered 2021-09-08: 20 mg via INTRAVENOUS

## 2021-09-08 MED ORDER — MIDAZOLAM HCL 2 MG/2ML IJ SOLN
INTRAMUSCULAR | Status: DC | PRN
  Administered 2021-09-08: 2 mg via INTRAVENOUS

## 2021-09-08 MED ORDER — EPHEDRINE 5 MG/ML INJ
INTRAVENOUS | Status: AC
  Filled 2021-09-08: qty 5

## 2021-09-08 MED ORDER — SODIUM CHLORIDE 0.9% FLUSH: 3.0000 mL | Freq: Two times a day (BID) | INTRAVENOUS | Status: DC

## 2021-09-08 MED ORDER — VITAMIN B-12 1000 MCG PO TABS
1000.0000 ug | ORAL_TABLET | Freq: Every day | ORAL | Status: DC
  Administered 2021-09-08: 1000 ug via ORAL
  Filled 2021-09-08: qty 1

## 2021-09-08 MED ORDER — CHLORHEXIDINE GLUCONATE 0.12 % MT SOLN
15.0000 mL | Freq: Once | OROMUCOSAL | Status: AC
  Administered 2021-09-08: 15 mL via OROMUCOSAL
  Filled 2021-09-08: qty 15

## 2021-09-08 MED ORDER — LIDOCAINE 2% (20 MG/ML) 5 ML SYRINGE
INTRAMUSCULAR | Status: DC | PRN
Start: 2021-09-08 — End: 2021-09-08
  Administered 2021-09-08: 60 mg via INTRAVENOUS

## 2021-09-08 MED ORDER — LACTATED RINGERS IV SOLN: INTRAVENOUS | Status: DC | PRN

## 2021-09-08 MED ORDER — ONDANSETRON HCL 4 MG/2ML IJ SOLN
INTRAMUSCULAR | Status: DC | PRN
  Administered 2021-09-08: 4 mg via INTRAVENOUS

## 2021-09-08 MED ORDER — SENNOSIDES-DOCUSATE SODIUM 8.6-50 MG PO TABS: 1.0000 | ORAL_TABLET | Freq: Every evening | ORAL | Status: DC | PRN

## 2021-09-08 MED ORDER — PHENYLEPHRINE 40 MCG/ML (10ML) SYRINGE FOR IV PUSH (FOR BLOOD PRESSURE SUPPORT)
PREFILLED_SYRINGE | INTRAVENOUS | Status: AC
  Filled 2021-09-08: qty 10

## 2021-09-08 MED ORDER — ONDANSETRON HCL 4 MG/2ML IJ SOLN: 4.0000 mg | Freq: Four times a day (QID) | INTRAMUSCULAR | Status: DC | PRN

## 2021-09-08 MED ORDER — 0.9 % SODIUM CHLORIDE (POUR BTL) OPTIME
TOPICAL | Status: DC | PRN
  Administered 2021-09-08 (×4): 1000 mL

## 2021-09-08 MED ORDER — ROSUVASTATIN CALCIUM 5 MG PO TABS: 5.0000 mg | ORAL_TABLET | ORAL | Status: DC

## 2021-09-08 MED ORDER — MENTHOL 3 MG MT LOZG: 1.0000 | LOZENGE | OROMUCOSAL | Status: DC | PRN

## 2021-09-08 MED ORDER — ONDANSETRON HCL 4 MG/2ML IJ SOLN
INTRAMUSCULAR | Status: AC
  Filled 2021-09-08: qty 2

## 2021-09-08 MED ORDER — ALBUMIN HUMAN 5 % IV SOLN
12.5000 g | Freq: Once | INTRAVENOUS | Status: AC
  Administered 2021-09-08: 12.5 g via INTRAVENOUS

## 2021-09-08 MED ORDER — SUGAMMADEX SODIUM 200 MG/2ML IV SOLN
INTRAVENOUS | Status: DC | PRN
  Administered 2021-09-08: 100 mg via INTRAVENOUS
  Administered 2021-09-08: 200 mg via INTRAVENOUS

## 2021-09-08 MED ORDER — BUPIVACAINE LIPOSOME 1.3 % IJ SUSP
INTRAMUSCULAR | Status: AC
  Filled 2021-09-08: qty 20

## 2021-09-08 MED ORDER — THROMBIN 20000 UNITS EX SOLR
CUTANEOUS | Status: DC | PRN
Start: 2021-09-08 — End: 2021-09-08
  Administered 2021-09-08: 20000 [IU] via TOPICAL

## 2021-09-08 MED ORDER — LACTATED RINGERS IV SOLN: INTRAVENOUS | Status: DC

## 2021-09-08 MED ORDER — ROCURONIUM BROMIDE 10 MG/ML (PF) SYRINGE
PREFILLED_SYRINGE | INTRAVENOUS | Status: AC
  Filled 2021-09-08: qty 10

## 2021-09-08 MED ORDER — MIDAZOLAM HCL 2 MG/2ML IJ SOLN
INTRAMUSCULAR | Status: AC
  Filled 2021-09-08: qty 2

## 2021-09-08 MED ORDER — ACETAMINOPHEN 325 MG PO TABS: 650.0000 mg | ORAL_TABLET | ORAL | Status: DC | PRN

## 2021-09-08 MED ORDER — CEFAZOLIN SODIUM-DEXTROSE 2-4 GM/100ML-% IV SOLN
INTRAVENOUS | Status: AC
  Filled 2021-09-08: qty 100

## 2021-09-08 MED ORDER — ACETAMINOPHEN 650 MG RE SUPP: 650.0000 mg | RECTAL | Status: DC | PRN

## 2021-09-08 MED ORDER — PHENYLEPHRINE 40 MCG/ML (10ML) SYRINGE FOR IV PUSH (FOR BLOOD PRESSURE SUPPORT)
PREFILLED_SYRINGE | INTRAVENOUS | Status: DC | PRN
  Administered 2021-09-08 (×5): 80 ug via INTRAVENOUS

## 2021-09-08 MED ORDER — THROMBIN 20000 UNITS EX SOLR
CUTANEOUS | Status: AC
  Filled 2021-09-08: qty 20000

## 2021-09-08 MED ORDER — LISDEXAMFETAMINE DIMESYLATE 50 MG PO CAPS: 50.0000 mg | ORAL_CAPSULE | ORAL | Status: DC

## 2021-09-08 MED ORDER — TRAZODONE HCL 50 MG PO TABS
50.0000 mg | ORAL_TABLET | Freq: Every day | ORAL | Status: DC
  Administered 2021-09-08: 100 mg via ORAL
  Filled 2021-09-08: qty 2

## 2021-09-08 MED ORDER — POTASSIUM CHLORIDE IN NACL 20-0.9 MEQ/L-% IV SOLN: INTRAVENOUS | Status: DC

## 2021-09-08 MED ORDER — ONDANSETRON HCL 4 MG PO TABS: 4.0000 mg | ORAL_TABLET | Freq: Four times a day (QID) | ORAL | Status: DC | PRN

## 2021-09-08 MED ORDER — FENTANYL CITRATE (PF) 250 MCG/5ML IJ SOLN
INTRAMUSCULAR | Status: AC
  Filled 2021-09-08: qty 5

## 2021-09-08 MED ORDER — OXYCODONE-ACETAMINOPHEN 5-325 MG PO TABS
1.0000 | ORAL_TABLET | ORAL | Status: DC | PRN
  Administered 2021-09-08: 2 via ORAL
  Administered 2021-09-08: 1 via ORAL
  Filled 2021-09-08: qty 1
  Filled 2021-09-08: qty 2

## 2021-09-08 MED ORDER — FLEET ENEMA 7-19 GM/118ML RE ENEM: 1.0000 | ENEMA | Freq: Once | RECTAL | Status: DC | PRN

## 2021-09-08 MED ORDER — BUSPIRONE HCL 15 MG PO TABS
15.0000 mg | ORAL_TABLET | Freq: Two times a day (BID) | ORAL | Status: DC
  Administered 2021-09-08: 15 mg via ORAL
  Filled 2021-09-08 (×3): qty 1

## 2021-09-08 MED ORDER — B COMPLEX PO TABS: 1.0000 | ORAL_TABLET | Freq: Every day | ORAL | Status: DC

## 2021-09-08 MED ORDER — ALBUMIN HUMAN 5 % IV SOLN
INTRAVENOUS | Status: AC
  Filled 2021-09-08: qty 250

## 2021-09-08 MED ORDER — SERTRALINE HCL 50 MG PO TABS: 50.0000 mg | ORAL_TABLET | Freq: Every day | ORAL | Status: DC

## 2021-09-08 MED ORDER — ZOLPIDEM TARTRATE 5 MG PO TABS: 5.0000 mg | ORAL_TABLET | Freq: Every evening | ORAL | Status: DC | PRN

## 2021-09-08 MED ORDER — DIPHENHYDRAMINE HCL 25 MG PO CAPS: 25.0000 mg | ORAL_CAPSULE | Freq: Four times a day (QID) | ORAL | Status: DC | PRN

## 2021-09-08 MED ORDER — PHENYLEPHRINE HCL-NACL 20-0.9 MG/250ML-% IV SOLN
INTRAVENOUS | Status: DC | PRN
  Administered 2021-09-08: 80 ug/min via INTRAVENOUS

## 2021-09-08 MED ORDER — CEFAZOLIN SODIUM-DEXTROSE 2-4 GM/100ML-% IV SOLN
2.0000 g | INTRAVENOUS | Status: AC
  Administered 2021-09-08 (×2): 2 g via INTRAVENOUS
  Filled 2021-09-08: qty 100

## 2021-09-08 MED ORDER — METHOCARBAMOL 1000 MG/10ML IJ SOLN
500.0000 mg | Freq: Four times a day (QID) | INTRAVENOUS | Status: DC | PRN
  Filled 2021-09-08: qty 5

## 2021-09-08 MED ORDER — POVIDONE-IODINE 7.5 % EX SOLN
Freq: Once | CUTANEOUS | Status: AC
  Filled 2021-09-08: qty 118

## 2021-09-08 MED ORDER — BUPIVACAINE-EPINEPHRINE (PF) 0.25% -1:200000 IJ SOLN
INTRAMUSCULAR | Status: AC
  Filled 2021-09-08: qty 30

## 2021-09-08 MED ORDER — HYDROMORPHONE HCL 1 MG/ML IJ SOLN
INTRAMUSCULAR | Status: AC
  Filled 2021-09-08: qty 1

## 2021-09-08 MED ORDER — ALPRAZOLAM 0.5 MG PO TABS: 0.5000 mg | ORAL_TABLET | Freq: Every day | ORAL | Status: DC | PRN

## 2021-09-08 MED ORDER — ORAL CARE MOUTH RINSE: 15.0000 mL | Freq: Once | OROMUCOSAL | Status: AC

## 2021-09-08 MED ORDER — PHENOL 1.4 % MT LIQD: 1.0000 | OROMUCOSAL | Status: DC | PRN

## 2021-09-08 MED ORDER — GLYCOPYRROLATE PF 0.2 MG/ML IJ SOSY
PREFILLED_SYRINGE | INTRAMUSCULAR | Status: DC | PRN
  Administered 2021-09-08: .2 mg via INTRAVENOUS

## 2021-09-08 MED ORDER — BUPIVACAINE LIPOSOME 1.3 % IJ SUSP
INTRAMUSCULAR | Status: DC | PRN
  Administered 2021-09-08: 20 mL

## 2021-09-08 MED ORDER — CEFAZOLIN SODIUM-DEXTROSE 2-4 GM/100ML-% IV SOLN
2.0000 g | Freq: Three times a day (TID) | INTRAVENOUS | Status: AC
  Administered 2021-09-08 – 2021-09-09 (×2): 2 g via INTRAVENOUS
  Filled 2021-09-08 (×2): qty 100

## 2021-09-08 MED ORDER — SODIUM CHLORIDE 0.9 % IV SOLN: 250.0000 mL | INTRAVENOUS | Status: DC

## 2021-09-08 MED ORDER — HYDROCODONE-ACETAMINOPHEN 5-325 MG PO TABS: 1.0000 | ORAL_TABLET | ORAL | Status: DC | PRN

## 2021-09-08 MED ORDER — ALBUMIN HUMAN 5 % IV SOLN
25.0000 g | Freq: Once | INTRAVENOUS | Status: DC
Start: 2021-09-08 — End: 2021-09-08

## 2021-09-08 MED ORDER — PROPOFOL 10 MG/ML IV BOLUS
INTRAVENOUS | Status: AC
  Filled 2021-09-08: qty 20

## 2021-09-08 MED ORDER — METHOCARBAMOL 500 MG PO TABS
500.0000 mg | ORAL_TABLET | Freq: Four times a day (QID) | ORAL | Status: DC | PRN
  Administered 2021-09-08 – 2021-09-09 (×3): 500 mg via ORAL
  Filled 2021-09-08 (×3): qty 1

## 2021-09-08 MED ORDER — DEXMEDETOMIDINE (PRECEDEX) IN NS 20 MCG/5ML (4 MCG/ML) IV SYRINGE
PREFILLED_SYRINGE | INTRAVENOUS | Status: DC | PRN
  Administered 2021-09-08: 12 ug via INTRAVENOUS

## 2021-09-08 MED ORDER — PROPOFOL 10 MG/ML IV BOLUS
INTRAVENOUS | Status: DC | PRN
  Administered 2021-09-08: 130 mg via INTRAVENOUS

## 2021-09-08 MED ORDER — BISACODYL 5 MG PO TBEC: 5.0000 mg | DELAYED_RELEASE_TABLET | Freq: Every day | ORAL | Status: DC | PRN

## 2021-09-08 MED ORDER — ALUM & MAG HYDROXIDE-SIMETH 200-200-20 MG/5ML PO SUSP: 30.0000 mL | Freq: Four times a day (QID) | ORAL | Status: DC | PRN

## 2021-09-08 MED ORDER — GLUCOSAMINE-CHONDROITIN 500-400 MG PO TABS: 1.0000 | ORAL_TABLET | Freq: Every day | ORAL | Status: DC

## 2021-09-08 MED ORDER — DOCUSATE SODIUM 100 MG PO CAPS
100.0000 mg | ORAL_CAPSULE | Freq: Two times a day (BID) | ORAL | Status: DC
  Administered 2021-09-08: 100 mg via ORAL
  Filled 2021-09-08: qty 1

## 2021-09-08 SURGICAL SUPPLY — 87 items
BAG COUNTER SPONGE SURGICOUNT (BAG) ×2 IMPLANT
BENZOIN TINCTURE PRP APPL 2/3 (GAUZE/BANDAGES/DRESSINGS) ×1 IMPLANT
BLADE CLIPPER SURG (BLADE) IMPLANT
BUR PRESCISION 1.7 ELITE (BURR) ×2 IMPLANT
BUR ROUND FLUTED 5 RND (BURR) ×2 IMPLANT
BUR ROUND PRECISION 4.0 (BURR) IMPLANT
BUR SABER RD CUTTING 3.0 (BURR) IMPLANT
CAGE SABLE 10X26 6-12 8D (Cage) ×1 IMPLANT
CAGE SABLE 10X30 9-16 8D (Cage) ×1 IMPLANT
CANNULA GRAFT BNE VG PRE-FILL (Bone Implant) IMPLANT
CARTRIDGE OIL MAESTRO DRILL (MISCELLANEOUS) ×1 IMPLANT
CNTNR URN SCR LID CUP LEK RST (MISCELLANEOUS) ×1 IMPLANT
CONT SPEC 4OZ STRL OR WHT (MISCELLANEOUS) ×2
COVER MAYO STAND STRL (DRAPES) ×4 IMPLANT
COVER SURGICAL LIGHT HANDLE (MISCELLANEOUS) ×2 IMPLANT
DIFFUSER DRILL AIR PNEUMATIC (MISCELLANEOUS) ×2 IMPLANT
DISPENSER GRAFT BNE VG (MISCELLANEOUS) IMPLANT
DISPENSER VIVIGEN BONE GRAFT (MISCELLANEOUS) ×2 IMPLANT
DRAIN CHANNEL 15F RND FF W/TCR (WOUND CARE) IMPLANT
DRAPE C-ARM 42X72 X-RAY (DRAPES) ×2 IMPLANT
DRAPE C-ARMOR (DRAPES) ×1 IMPLANT
DRAPE POUCH INSTRU U-SHP 10X18 (DRAPES) ×2 IMPLANT
DRAPE SURG 17X23 STRL (DRAPES) ×8 IMPLANT
DURAPREP 26ML APPLICATOR (WOUND CARE) ×2 IMPLANT
ELECT BLADE 4.0 EZ CLEAN MEGAD (MISCELLANEOUS) ×2
ELECT CAUTERY BLADE 6.4 (BLADE) ×2 IMPLANT
ELECT REM PT RETURN 9FT ADLT (ELECTROSURGICAL) ×2
ELECTRODE BLDE 4.0 EZ CLN MEGD (MISCELLANEOUS) ×1 IMPLANT
ELECTRODE REM PT RTRN 9FT ADLT (ELECTROSURGICAL) ×1 IMPLANT
EVACUATOR SILICONE 100CC (DRAIN) IMPLANT
FILTER STRAW FLUID ASPIR (MISCELLANEOUS) ×2 IMPLANT
GAUZE 4X4 16PLY ~~LOC~~+RFID DBL (SPONGE) ×2 IMPLANT
GAUZE SPONGE 4X4 12PLY STRL (GAUZE/BANDAGES/DRESSINGS) ×1 IMPLANT
GLOVE SRG 8 PF TXTR STRL LF DI (GLOVE) ×1 IMPLANT
GLOVE SURG ENC MOIS LTX SZ6.5 (GLOVE) ×2 IMPLANT
GLOVE SURG ENC MOIS LTX SZ8 (GLOVE) ×2 IMPLANT
GLOVE SURG UNDER POLY LF SZ7 (GLOVE) ×2 IMPLANT
GLOVE SURG UNDER POLY LF SZ8 (GLOVE) ×2
GOWN STRL REUS W/ TWL LRG LVL3 (GOWN DISPOSABLE) ×2 IMPLANT
GOWN STRL REUS W/ TWL XL LVL3 (GOWN DISPOSABLE) ×1 IMPLANT
GOWN STRL REUS W/TWL LRG LVL3 (GOWN DISPOSABLE) ×4
GOWN STRL REUS W/TWL XL LVL3 (GOWN DISPOSABLE) ×2
GRAFT BONE CANNULA VIVIGEN 3 (Bone Implant) ×10 IMPLANT
IV CATH 14GX2 1/4 (CATHETERS) ×2 IMPLANT
KIT BASIN OR (CUSTOM PROCEDURE TRAY) ×2 IMPLANT
KIT POSITION SURG JACKSON T1 (MISCELLANEOUS) ×2 IMPLANT
KIT TURNOVER KIT B (KITS) ×2 IMPLANT
MARKER SKIN DUAL TIP RULER LAB (MISCELLANEOUS) ×5 IMPLANT
NDL 18GX1X1/2 (RX/OR ONLY) (NEEDLE) ×1 IMPLANT
NDL HYPO 25GX1X1/2 BEV (NEEDLE) ×1 IMPLANT
NDL SPNL 18GX3.5 QUINCKE PK (NEEDLE) ×2 IMPLANT
NEEDLE 18GX1X1/2 (RX/OR ONLY) (NEEDLE) ×2 IMPLANT
NEEDLE 22X1 1/2 (OR ONLY) (NEEDLE) ×4 IMPLANT
NEEDLE HYPO 25GX1X1/2 BEV (NEEDLE) ×2 IMPLANT
NEEDLE SPNL 18GX3.5 QUINCKE PK (NEEDLE) ×4 IMPLANT
NS IRRIG 1000ML POUR BTL (IV SOLUTION) ×3 IMPLANT
OIL CARTRIDGE MAESTRO DRILL (MISCELLANEOUS) ×2
PACK LAMINECTOMY ORTHO (CUSTOM PROCEDURE TRAY) ×2 IMPLANT
PACK UNIVERSAL I (CUSTOM PROCEDURE TRAY) ×2 IMPLANT
PAD ARMBOARD 7.5X6 YLW CONV (MISCELLANEOUS) ×4 IMPLANT
PATTIES SURGICAL .5 X1 (DISPOSABLE) ×3 IMPLANT
ROD EXPEDIUM PER BENT 65MM (Rod) ×2 IMPLANT
SCREW SET SINGLE INNER (Screw) ×6 IMPLANT
SCREW VIPER CORT FIX 6.00X30 (Screw) ×4 IMPLANT
SCREW VIPER CORT FIX 6X35 (Screw) ×2 IMPLANT
SPONGE INTESTINAL PEANUT (DISPOSABLE) ×2 IMPLANT
SPONGE SURGIFOAM ABS GEL 100 (HEMOSTASIS) ×2 IMPLANT
SPONGE T-LAP 18X18 ~~LOC~~+RFID (SPONGE) ×1 IMPLANT
SPONGE T-LAP 4X18 ~~LOC~~+RFID (SPONGE) ×4 IMPLANT
STRIP CLOSURE SKIN 1/2X4 (GAUZE/BANDAGES/DRESSINGS) ×1 IMPLANT
SURGIFLO W/THROMBIN 8M KIT (HEMOSTASIS) IMPLANT
SUT MNCRL AB 4-0 PS2 18 (SUTURE) ×2 IMPLANT
SUT VIC AB 0 CT1 18XCR BRD 8 (SUTURE) ×1 IMPLANT
SUT VIC AB 0 CT1 8-18 (SUTURE) ×2
SUT VIC AB 1 CT1 18XCR BRD 8 (SUTURE) ×1 IMPLANT
SUT VIC AB 1 CT1 8-18 (SUTURE) ×4
SUT VIC AB 2-0 CT2 18 VCP726D (SUTURE) ×3 IMPLANT
SYR 20ML LL LF (SYRINGE) ×4 IMPLANT
SYR BULB IRRIG 60ML STRL (SYRINGE) ×2 IMPLANT
SYR CONTROL 10ML LL (SYRINGE) ×4 IMPLANT
SYR TB 1ML LUER SLIP (SYRINGE) ×1 IMPLANT
TAP EXPEDIUM DL 5.0 (INSTRUMENTS) ×1 IMPLANT
TAP EXPEDIUM DL 6.0 (INSTRUMENTS) ×1 IMPLANT
TAPE CLOTH SURG 6X10 WHT LF (GAUZE/BANDAGES/DRESSINGS) ×1 IMPLANT
TRAY FOLEY MTR SLVR 16FR STAT (SET/KITS/TRAYS/PACK) ×1 IMPLANT
WATER STERILE IRR 1000ML POUR (IV SOLUTION) ×1 IMPLANT
YANKAUER SUCT BULB TIP NO VENT (SUCTIONS) ×2 IMPLANT

## 2021-09-08 NOTE — TOC Initial Note (Signed)
Transition of Care [facility]) - Initial/Assessment Note    Patient Details  Name: Michelle Donaldson MRN: [id number] Date of Birth: 1956-03-05  [facility]) CM/SW Contact:    Lockie Pares, RN Phone Number: 09/08/2021, 4:06 PM  Clinical Narrative:                 Patient presented  for radiculopathy. Lumbar fusion bilaterally done 2 levels. PT/ OT consults for recommendations.  May need DME and HH to assist with recovery.  CM will follow patient for needs, recommendations and transitions.    Barriers to Discharge: Continued Medical Work up   Patient Goals and CMS Choice        Expected Discharge Plan and Services     Discharge Planning Services: CM Consult   Living arrangements for the past 2 months: Single Family Home Expected Discharge Date: 09/09/21                                    Prior Living Arrangements/Services Living arrangements for the past 2 months: Single Family Home Lives with:: Spouse Patient language and need for interpreter reviewed:: Yes        Need for Family Participation in Patient Care: Yes (Comment) Care giver support system in place?: Yes (comment)   Criminal Activity/Legal Involvement Pertinent to Current Situation/Hospitalization: No - Comment as needed  Activities of Daily Living      Permission Sought/Granted                  Emotional Assessment       Orientation: : Oriented to Self, Oriented to Place, Oriented to  Time, Oriented to Situation Alcohol / Substance Use: Not Applicable Psych Involvement: No (comment)  Admission diagnosis:  Radiculopathy [M54.10] Patient Active Problem List   Diagnosis Date Noted   Radiculopathy 09/08/2021   Gastroesophageal reflux disease without esophagitis 12/04/2017   Hoarseness 12/04/2017   Seasonal allergic rhinitis 01/09/2017   Bilateral hearing loss 12/01/2016   Deviated septum 12/01/2016   ETD (Eustachian tube dysfunction), bilateral 12/01/2016   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

## 2021-09-08 NOTE — Anesthesia Procedure Notes (Signed)
Procedure Name: Intubation Date/Time: 09/08/2021 7:39 AM Performed by: Shary Decamp, CRNA Pre-anesthesia Checklist: Patient identified, Patient being monitored, Timeout performed, Emergency Drugs available and Suction available Patient Re-evaluated:Patient Re-evaluated prior to induction Oxygen Delivery Method: Circle System Utilized Preoxygenation: Pre-oxygenation with 100% oxygen Induction Type: IV induction Ventilation: Mask ventilation without difficulty Laryngoscope Size: Miller and 2 Grade View: Grade I Tube type: Oral Tube size: 7.0 mm Number of attempts: 1 Airway Equipment and Method: Stylet Placement Confirmation: ETT inserted through vocal cords under direct vision, positive ETCO2 and breath sounds checked- equal and bilateral Secured at: 21 cm Tube secured with: Tape Dental Injury: Teeth and Oropharynx as per pre-operative assessment

## 2021-09-08 NOTE — Transfer of Care (Signed)
Immediate Anesthesia Transfer of Care Note  Patient: Michelle Donaldson  Procedure(s) Performed: RIGHT-SIDED LUMBAR 3 - LUMBAR 4, LUMBAR 4- LUMBAR 5 TRANSFORAMINAL LUMBAR INTERBODY FUSION WITH INSTRUMENTATION AND ALLOGRAFT (Right)  Patient Location: PACU  Anesthesia Type:General  Level of Consciousness: awake, alert , oriented and patient cooperative  Airway & Oxygen Therapy: Patient Spontanous Breathing and Patient connected to nasal cannula oxygen  Post-op Assessment: Report given to RN, Post -op Vital signs reviewed and stable and Patient moving all extremities  Post vital signs: Reviewed and stable  Last Vitals:  Vitals Value Taken Time  BP 92/61 09/08/21 1246  Temp    Pulse 73 09/08/21 1248  Resp 19 09/08/21 1248  SpO2 100 % 09/08/21 1248  Vitals shown include unvalidated device data.  Last Pain:  Vitals:   09/08/21 0629  TempSrc:   PainSc: 4       Patients Stated Pain Goal: 0 (09/08/21 0629)  Complications: No notable events documented.

## 2021-09-08 NOTE — Anesthesia Postprocedure Evaluation (Signed)
Anesthesia Post Note  Patient: MADINE SARR  Procedure(s) Performed: RIGHT-SIDED LUMBAR 3 - LUMBAR 4, LUMBAR 4- LUMBAR 5 TRANSFORAMINAL LUMBAR INTERBODY FUSION WITH INSTRUMENTATION AND ALLOGRAFT (Right)     Patient location during evaluation: PACU Anesthesia Type: General Level of consciousness: awake and alert Pain management: pain level controlled Vital Signs Assessment: post-procedure vital signs reviewed and stable Respiratory status: spontaneous breathing, nonlabored ventilation and respiratory function stable Cardiovascular status: blood pressure returned to baseline and stable Postop Assessment: no apparent nausea or vomiting Anesthetic complications: no   No notable events documented.  Last Vitals:  Vitals:   09/08/21 1400 09/08/21 1415  BP: (!) 91/57 98/64  Pulse: (!) 56 60  Resp: 20 17  Temp:  36.8 C  SpO2: 98% 94%    Last Pain:  Vitals:   09/08/21 1415  TempSrc:   PainSc: Asleep                 Neomi Laidler,W. EDMOND

## 2021-09-08 NOTE — Op Note (Addendum)
PATIENT NAME: Michelle Donaldson   MEDICAL RECORD NO.:   [id number]    DATE OF BIRTH: [birth year]    DATE OF PROCEDURE: 09/08/2021                                 OPERATIVE REPORT     PREOPERATIVE DIAGNOSES: 1. Severe spinal stenosis L3-4, L4-5 (M48.062) 2. Bilateral lumbar radiculopathy (M54.16) 3. L3-4, L4-5 spondylolisthesis (M53.2X6)   POSTOPERATIVE DIAGNOSES: 1. Severe spinal stenosis L3-4, L4-5 (M48.062) 2. Bilateral lumbar radiculopathy (M54.16) 3. L3-4, L4-5 spondylolisthesis (M53.2X6)   PROCEDURES: 1. Lumbar decompression, L3-4, L4-5, including bilateral partial facetectomy, and bilateral lumbar decompression 2. Right-sided L3-4, L4-5 transforaminal lumbar interbody fusion. 3. Left-sided L3-4, L4-5 posterolateral fusion. 4. Insertion of interbody device x 2 (Glubus expandable intervertebral spacers). 5. Placement of segmental posterior instrumentation L3, L4, L5, bilaterally. 6. Use of local autograft. 7. Use of morselized allograft - ViviGen. 8. Intraoperative use of fluoroscopy.   SURGEON:  Estill Bamberg, MD.   ASSISTANTJason Coop, PA-C.   ANESTHESIA:  General endotracheal anesthesia.   COMPLICATIONS:  None.   DISPOSITION:  Stable.   ESTIMATED BLOOD LOSS:  300cc (50cc retransfused via cell saver)   INDICATIONS FOR SURGERY:  Briefly, Michelle Donaldson is a pleasant 66 y.o. -year-old patient who did present to me with severe and ongoing pain in the bilateral legs, right greater than left. I did feel that the symptoms were secondary to the findings noted above.   The patient failed conservative care and did wish to proceed with the procedure  noted above.    OPERATIVE DETAILS:  On 09/08/2021, the patient was brought to surgery and general endotracheal anesthesia was administered.  The patient was placed prone on a well-padded flat Jackson bed with a spinal frame.  Antibiotics were given and a time-out procedure was performed. The back was prepped and draped in  the usual fashion.  A midline incision was made overlying the L3-4 and L4-5 intervertebral spaces.  The fascia was incised at the midline.  The paraspinal musculature was bluntly swept laterally.  Anatomic landmarks for the pedicles were exposed. Using fluoroscopy, I did cannulate the L3, L4, and L5 pedicles bilaterally, using a medial to lateral cortical trajectory technique.  On the left side, the posterolateral gutter and facet joints at L3-4 and L4-5 were decorticated and 6 mm screws of the appropriate length were placed at L3, L4, and L5 pedicles and a 65-mm rod was placed and distraction was applied across the rod at each intervertebral level.  On the right side, the cannulated pedicle holes were filled with bone wax.  I then proceeded with the decompressive aspect of the procedure.     Starting at L4-5, I did perform a bilateral lateral recess decompression, using a high-speed bur and Kerrison punches.  Partial facetectomies were performed bilaterally.  This did decompress the right and left lateral recess thoroughly.  I was very pleased with the decompression on the right and left sides.  I then proceeded with a near full facetectomy on the right side.  At this point, with an assistant holding medial retraction of the traversing right L5 nerve, I did perform a thorough and complete L4-5 intervertebral discectomy.  The intervertebral space was then liberally packed with autograft from the decompression, as well as allograft in the form of ViviGen, as was the appropriately sized intervertebral spacer.  The spacer was then expanded to approximately 13  mm in height.  Additional allograft was packed into the spacer after expansion. Distraction was then released on the contralateral left side.  I then turned my attention to the L3-4 level.  Once again, it was clearly evident that there was stenosis at the L3-4 level.  The stenosis was thoroughly and adequately decompressed by performing a bilateral  partial facetectomy using a high-speed bur and Kerrison punches.  Once again, I was pleased with the decompression on the right and left sides.  As performed at the L4-5 level, a near full facetectomy was performed on the right, and then, with an assistant holding medial retraction of the traversing right L4 nerve, I did perform an annulotomy at the posterolateral aspect of the L3-4 intervertebral space.  I then used a series of curettes and pituitary rongeurs to perform a thorough and complete intervertebral diskectomy.  The intervertebral space was then liberally packed with autograft as well as allograft in the form of ViviGen, as was the appropriate-sized intervertebral spacer.  The spacer was then tamped into position in the usual fashion.  The spacer was then expanded to approximately 8.5 mm in height.  Additional allograft was packed into the spacer after expansion. Distraction was then released on the contralateral left side. I was very pleased with the press-fit of the spacer.  I then placed 6 mm screws on the right at L3, L4, and L5.  A 65-mm rod was then placed and caps were placed. All 6 caps were then locked.  The wound was copiously irrigated with a total of approximately 3 L prior to placing the bone graft.  Additional autograft and allograft was then packed into the posterolateral gutter on the left side to help aid in the success of the fusion.  The wound was  explored for any undue bleeding and there was no substantial bleeding encountered.  Gel-Foam was placed over the laminectomy site.  The wound was then closed in layers using #1 Vicryl followed by 2-0 Vicryl, followed by 4-0 Monocryl.  Benzoin and Steri-Strips were applied followed by sterile dressing.       Of note, Jason Coop was my assistant throughout surgery, and did aid in retraction, placement of the hardware, the decompression, suctioning, and closure.     Estill Bamberg, MD

## 2021-09-08 NOTE — H&P (Signed)
PREOPERATIVE H&P  Chief Complaint: Bilateral leg pain  HPI: Michelle Donaldson is a 66 y.o. female who presents with ongoing pain in the bilateral legs  MRI reveals stenosis and instability at L3/4 and L4/5  Patient has failed multiple forms of conservative care and continues to have pain (see office notes for additional details regarding the patient's full course of treatment)  Past Medical History:  Diagnosis Date   ADHD (attention deficit hyperactivity disorder) 2015   Anxiety 2000   Depression    Heart murmur    diagnosed in her 20's, "hasn't been heard lately"   Hyperthyroidism 2000   per pt, "it corrected itself"   Migraine    Past Surgical History:  Procedure Laterality Date   ABDOMINAL HYSTERECTOMY  1986   partial   CESAREAN SECTION     x2   ROTATOR CUFF REPAIR     TOE SURGERY Left 2019   also "around 1980's had surgery on both feet" (to correct bone spur)   Social History   Socioeconomic History   Marital status: Married    Spouse name: Not on file   Number of children: Not on file   Years of education: Not on file   Highest education level: Not on file  Occupational History   Not on file  Tobacco Use   Smoking status: Never   Smokeless tobacco: Never  Vaping Use   Vaping Use: Never used  Substance and Sexual Activity   Alcohol use: Yes    Comment: pt states maybe one drink a month   Drug use: Never   Sexual activity: Not on file  Other Topics Concern   Not on file  Social History Narrative   Not on file   Social Determinants of Health   Financial Resource Strain: Not on file  Food Insecurity: Not on file  Transportation Needs: Not on file  Physical Activity: Not on file  Stress: Not on file  Social Connections: Not on file   History reviewed. No pertinent family history. Allergies  Allergen Reactions   Latex     Skin irritation    Prior to Admission medications   Medication Sig Start Date End Date Taking? Authorizing Provider   acetaminophen (TYLENOL) 500 MG tablet Take 1,000 mg by mouth every 6 (six) hours as needed for mild pain.   Yes [provider]  acetaminophen-codeine (TYLENOL #3) 300-30 MG tablet Take by mouth at bedtime as needed for moderate pain.   Yes [provider]  ALPRAZolam Duanne Moron) 0.5 MG tablet Take 0.5 mg by mouth daily as needed for anxiety. 08/04/21  Yes [provider]  b complex vitamins tablet Take 1 tablet by mouth daily.   Yes [provider]  busPIRone (BUSPAR) 15 MG tablet Take 15 mg by mouth in the morning and at bedtime.   Yes [provider]  celecoxib (CELEBREX) 200 MG capsule Take 200 mg by mouth daily as needed for pain. 08/22/21  Yes [provider]  ELDERBERRY PO Take 1 capsule by mouth daily.   Yes [provider]  Flaxseed, Linseed, (FLAXSEED OIL PO) Take 1,300 mg elemental calcium/kg/hr by mouth daily.   Yes [provider]  gabapentin (NEURONTIN) 300 MG capsule Take 300 mg by mouth at bedtime as needed for pain. 08/15/21  Yes [provider]  glucosamine-chondroitin 500-400 MG tablet Take 1 tablet by mouth daily.   Yes [provider]  ibuprofen (ADVIL) 200 MG tablet Take 400 mg  by mouth every 6 (six) hours as needed for mild pain.   Yes [provider]  Multiple Vitamin (MULTIVITAMIN WITH MINERALS) TABS tablet Take 1 tablet by mouth daily.   Yes [provider]  naproxen sodium (ALEVE) 220 MG tablet Take 220-440 mg by mouth daily as needed (pain/headache).   Yes [provider]  rosuvastatin (CRESTOR) 5 MG tablet Take 5 mg by mouth 2 (two) times a week. 07/20/21  Yes [provider]  sertraline (ZOLOFT) 50 MG tablet Take 50-75 mg by mouth daily. 05/23/18  Yes [provider]  traZODone (DESYREL) 50 MG tablet Take 50-100 mg by mouth at bedtime. 11/03/20  Yes [provider]  vitamin B-12 (CYANOCOBALAMIN) 1000 MCG tablet Take 1,000 mcg by mouth  daily.   Yes [provider]  VYVANSE 50 MG capsule Take 50 mg by mouth See admin instructions. 50 mg daily on weekdays 08/04/21  Yes [provider]  YUVAFEM 10 MCG TABS vaginal tablet Place 10 mcg vaginally 2 (two) times a week. Sunday night and Wednesday night 11/15/20  Yes [provider]     All other systems have been reviewed and were otherwise negative with the exception of those mentioned in the HPI and as above.  Physical Exam: Vitals:   09/08/21 0601  BP: (!) 145/80  Pulse: 70  Resp: 17  Temp: 97.9 F (36.6 C)  SpO2: 97%    Body mass index is 33.56 kg/m.  General: Alert, no acute distress Cardiovascular: No pedal edema Respiratory: No cyanosis, no use of accessory musculature Skin: No lesions in the area of chief complaint Neurologic: Sensation intact distally Psychiatric: Patient is competent for consent with normal mood and affect Lymphatic: No axillary or cervical lymphadenopathy   Assessment/Plan: SPINAL STENOSIS, L3/4, L4/5 Plan for Procedure(s): RIGHT-SIDED LUMBAR 3 - LUMBAR 4, LUMBAR 4- LUMBAR 5 TRANSFORAMINAL LUMBAR INTERBODY FUSION WITH INSTRUMENTATION AND ALLOGRAFT   Norva Karvonen, MD 09/08/2021 6:37 AM

## 2021-09-09 MED ORDER — HYDROCODONE-ACETAMINOPHEN 5-325 MG PO TABS
1.0000 | ORAL_TABLET | ORAL | Status: DC | PRN
  Administered 2021-09-09 (×3): 2 via ORAL
  Filled 2021-09-09 (×3): qty 2

## 2021-09-09 MED ORDER — METHOCARBAMOL 500 MG PO TABS: 500.0000 mg | ORAL_TABLET | Freq: Four times a day (QID) | ORAL | 2 refills | Status: AC | PRN

## 2021-09-09 MED ORDER — HYDROMORPHONE HCL 1 MG/ML IJ SOLN
0.5000 mg | INTRAMUSCULAR | Status: DC | PRN
  Administered 2021-09-09: 1 mg via INTRAVENOUS
  Filled 2021-09-09: qty 1

## 2021-09-09 MED ORDER — OXYCODONE-ACETAMINOPHEN 7.5-325 MG PO TABS: 1.0000 | ORAL_TABLET | ORAL | 0 refills | Status: AC | PRN

## 2021-09-09 MED ORDER — OXYCODONE-ACETAMINOPHEN 7.5-325 MG PO TABS: 1.0000 | ORAL_TABLET | ORAL | Status: DC | PRN

## 2021-09-09 MED ORDER — METHOCARBAMOL 1000 MG/10ML IJ SOLN: 500.0000 mg | Freq: Four times a day (QID) | INTRAVENOUS | 2 refills | Status: DC | PRN

## 2021-09-09 NOTE — Progress Notes (Signed)
Patient awaiting transport via wheelchair by RN for discharge home; in no acute distress nor complaints of pain nor discomfort; incision on her back with gauze dressing and is clean, dry and intact with back brace on; room was checked and accounted for all her belongings; discharge instructions concerning her medications, incision care, follow up appointment and when to call the doctor as needed were all discussed with patient by RN and she expressed understanding on the instructions given.

## 2021-09-09 NOTE — Plan of Care (Signed)

## 2021-09-09 NOTE — Progress Notes (Signed)
° ° °  Patient was complaining of back pain and right LE numbness This has improved substantially Patient is now ambulating with 5/10 pain   Physical Exam: Vitals:   09/08/21 2308 09/09/21 0420  BP: 135/68 129/64  Pulse: 67 79  Resp: 18 18  Temp: 98.4 F (36.9 C) 98.5 F (36.9 C)  SpO2: 95% 92%    Dressing in place NVI  POD #1 s/p fusion, previously complaining of significant back pain and right LE numbness, which has improved  - etiology of this is unclear, as surgery was very uneventful, notable for a successful decompression, however this has improved a lot - up with PT/OT - Percocet for pain, Robaxin for muscle spasms - d/c home later today

## 2021-09-09 NOTE — Evaluation (Signed)
Physical Therapy Evaluation and Discharge Patient Details Name: Michelle Donaldson MRN: [id number] DOB: [birth year] Today's Date: 09/09/2021  History of Present Illness  Pt is a 66 y/o female who presents s/p L3-4 TLIF on 09/08/2021. PMH significant for bilateral HOH, ADHD, anxiety, depression, R rotator cuff repair, toe surgery bil feet 2019.   Clinical Impression  Patient evaluated by Physical Therapy with no further acute PT needs identified. All education has been completed and the patient has no further questions. Pt was able to demonstrate transfers and ambulation with gross modified independence by end of session with RW for support. Brace adjusted for optimal fit. Pt was educated on precautions, brace application/wearing schedule, appropriate activity progression, and car transfer. See below for any follow-up Physical Therapy or equipment needs. PT is signing off. Thank you for this referral.        Recommendations for follow up therapy are one component of a multi-disciplinary discharge planning process, led by the attending physician.  Recommendations may be updated based on patient status, additional functional criteria and insurance authorization.  Follow Up Recommendations No PT follow up    Assistance Recommended at Discharge PRN  Patient can return home with the following   (N/A)    Equipment Recommendations Rolling walker (2 wheels)  Recommendations for Other Services       Functional Status Assessment Patient has had a recent decline in their functional status and demonstrates the ability to make significant improvements in function in a reasonable and predictable amount of time.     Precautions / Restrictions Precautions Precautions: Back;Fall Precaution Booklet Issued: Yes (comment) Precaution Comments: Reviewed handout and pt was cued for precautions during functional mobility. Required Braces or Orthoses: Spinal Brace Spinal Brace: Thoracolumbosacral orthotic;Applied  in sitting position Restrictions Weight Bearing Restrictions: No      Mobility  Bed Mobility               General bed mobility comments: Reviewed verbally. Pt was received sitting up EOB.    Transfers Overall transfer level: Needs assistance Equipment used: Rolling walker (2 wheels) Transfers: Sit to/from Stand Sit to Stand: Supervision;Modified independent (Device/Increase time)                Ambulation/Gait Ambulation/Gait assistance: Supervision;Modified independent (Device/Increase time) Gait Distance (Feet): 300 Feet Assistive device: Rolling walker (2 wheels) Gait Pattern/deviations: Step-through pattern;Decreased stride length;Trunk flexed Gait velocity: Decreased Gait velocity interpretation: 1.31 - 2.62 ft/sec, indicative of limited community ambulator   General Gait Details: VC's for improved postire, closer walker proximity and forward gaze. Progressed to mod I by end of session.  Stairs Stairs: Yes Stairs assistance: Modified independent (Device/Increase time) Stair Management: One rail Right;Step to pattern;Forwards Number of Stairs: 2 General stair comments: VC's for sequencing and safety. Able to complete without assist and without any obvious difficulty.  Wheelchair Mobility    Modified Rankin (Stroke Patients Only)       Balance Overall balance assessment: Mild deficits observed, not formally tested                                           Pertinent Vitals/Pain Pain Assessment: Faces Faces Pain Scale: Hurts a little bit Pain Location: back Pain Descriptors / Indicators: Operative site guarding Pain Intervention(s): Limited activity within patient's tolerance;Monitored during session;Repositioned    Home Living Family/patient expects to be discharged to:: Private residence Living Arrangements:  Spouse/significant other Available Help at Discharge: Family;Available 24 hours/day Type of Home: House Home Access:  Stairs to enter Entrance Stairs-Rails: None Entrance Stairs-Number of Steps: 2   Home Layout: One level Home Equipment: Adaptive equipment;Shower seat Additional Comments: has a friend that will loan her 3n1 if needed. 2 dogs Charlie and Buddy    Prior Function Prior Level of Function : Independent/Modified Independent               ADLs Comments: sleeping on the couch and sitting on shower seat due to weakness bil LE     Hand Dominance   Dominant Hand: Right    Extremity/Trunk Assessment   Upper Extremity Assessment Upper Extremity Assessment: Defer to OT evaluation    Lower Extremity Assessment Lower Extremity Assessment: Generalized weakness (Consistent with pre-op diagnosis)    Cervical / Trunk Assessment Cervical / Trunk Assessment: Back Surgery  Communication   Communication: HOH  Cognition Arousal/Alertness: Awake/alert Behavior During Therapy: WFL for tasks assessed/performed Overall Cognitive Status: Within Functional Limits for tasks assessed                                          General Comments      Exercises     Assessment/Plan    PT Assessment Patient does not need any further PT services  PT Problem List         PT Treatment Interventions      PT Goals (Current goals can be found in the Care Plan section)  Acute Rehab PT Goals Patient Stated Goal: Home today PT Goal Formulation: All assessment and education complete, DC therapy    Frequency       Co-evaluation               AM-PAC PT "6 Clicks" Mobility  Outcome Measure Help needed turning from your back to your side while in a flat bed without using bedrails?: None Help needed moving from lying on your back to sitting on the side of a flat bed without using bedrails?: None Help needed moving to and from a bed to a chair (including a wheelchair)?: None Help needed standing up from a chair using your arms (e.g., wheelchair or bedside chair)?: None Help  needed to walk in hospital room?: None Help needed climbing 3-5 steps with a railing? : None 6 Click Score: 24    End of Session Equipment Utilized During Treatment: Gait belt;Back brace Activity Tolerance: Patient tolerated treatment well Patient left: with call bell/phone within reach (Sitting EOB) Nurse Communication: Mobility status PT Visit Diagnosis: Unsteadiness on feet (R26.81);Pain Pain - part of body:  (back)    Time: 4132-4401 PT Time Calculation (min) (ACUTE ONLY): 24 min   Charges:   PT Evaluation $PT Eval Low Complexity: 1 Low PT Treatments $Gait Training: 8-22 mins        Conni Slipper, PT, DPT Acute Rehabilitation Services Pager: [phone] Office: [phone]   Marylynn Pearson 09/09/2021, 2:51 PM

## 2021-09-09 NOTE — Progress Notes (Signed)
Patient  pain uncontrolled even with Morphine IV and 2percocet . Pt rates pain as 10/10. Also complaint of total numbness on RLE from thigh down to legs/foot which she did not have preop.  Patient upset and crying of pain. On call PA  notified, made aware of pt current status.  New order received- Dilaudid -1 mg PRN. Will monitor pt closely.

## 2021-09-09 NOTE — Evaluation (Signed)
Occupational Therapy Evaluation Patient Details Name: Michelle Donaldson MRN: [id number] DOB: 03-20-1956 Today's Date: 09/09/2021   History of Present Illness 66 yo female s/p L3-4 TLIF PMH bil HOH, ADHD anxiety depression R rotator cuff repair toe surgery bil feet 2019   Clinical Impression   Patient evaluated by Occupational Therapy with no further acute OT needs identified. All education has been completed and the patient has no further questions. See below for any follow-up Occupational Therapy or equipment needs. OT to sign off. Thank you for referral.        Recommendations for follow up therapy are one component of a multi-disciplinary discharge planning process, led by the attending physician.  Recommendations may be updated based on patient status, additional functional criteria and insurance authorization.   Follow Up Recommendations  No OT follow up    Assistance Recommended at Discharge PRN  Patient can return home with the following      Functional Status Assessment  Patient has had a recent decline in their functional status and demonstrates the ability to make significant improvements in function in a reasonable and predictable amount of time.  Equipment Recommendations  Other (comment) (RW)    Recommendations for Other Services       Precautions / Restrictions Precautions Precautions: Back Precaution Comments: handout provided and reviewed for adls Restrictions Weight Bearing Restrictions: No      Mobility Bed Mobility Overal bed mobility: Needs Assistance Bed Mobility: Rolling;Supine to Sit Rolling: Min guard   Supine to sit: Min guard     General bed mobility comments: verbalized then return demostrated bed mobility with cues but not physical (A)    Transfers                          Balance Overall balance assessment: Mild deficits observed, not formally tested                                         ADL either  performed or assessed with clinical judgement   ADL Overall ADL's : Needs assistance/impaired Eating/Feeding: Independent Eating/Feeding Details (indicate cue type and reason): educated on reaching on R side Grooming: Wash/dry hands;Independent Grooming Details (indicate cue type and reason): educated on sink height for items Upper Body Bathing: Modified independent   Lower Body Bathing: Minimal assistance Lower Body Bathing Details (indicate cue type and reason): will reacher long handle sponge Upper Body Dressing : Modified independent Upper Body Dressing Details (indicate cue type and reason): educated on don brace min (A) for first don Lower Body Dressing: Minimal assistance;Adhering to back precautions;With adaptive equipment Lower Body Dressing Details (indicate cue type and reason): using reacher and has reachers at home Toilet Transfer: Engineer, structural Details (indicate cue type and reason): educated with demo for shower transfer during session Functional mobility during ADLs: Supervision/safety;Rolling walker (2 wheels) General ADL Comments: cues for hand placement and not to pull on RW   Back handout provided and reviewed adls in detail. Pt educated on: clothing between brace, never sleep in brace, set an alarm at night for medication, avoid sitting for long periods of time, correct bed positioning for sleeping, correct sequence for bed mobility, avoiding lifting more than 5 pounds and never wash directly over incision. All education is complete and patient indicates understanding.   Vision  Baseline Vision/History: 1 Wears glasses (reading only)       Perception     Praxis      Pertinent Vitals/Pain Pain Assessment: Faces Faces Pain Scale: Hurts a little bit Pain Location: back Pain Descriptors / Indicators: Operative site guarding     Hand Dominance Right   Extremity/Trunk Assessment Upper Extremity Assessment Upper Extremity  Assessment: Overall WFL for tasks assessed   Lower Extremity Assessment Lower Extremity Assessment: Defer to PT evaluation   Cervical / Trunk Assessment Cervical / Trunk Assessment: Back Surgery   Communication Communication Communication: HOH   Cognition Arousal/Alertness: Awake/alert Behavior During Therapy: WFL for tasks assessed/performed Overall Cognitive Status: Within Functional Limits for tasks assessed                                       General Comments  VSS, incision dry, pt with personal photo on her phone as reference. dressing is dry dressing and advised not to get it wet    Exercises     Shoulder Instructions      Home Living Family/patient expects to be discharged to:: Private residence Living Arrangements: Spouse/significant other Available Help at Discharge: Family;Available 24 hours/day Type of Home: House Home Access: Stairs to enter [employer] of Steps: 2 Entrance Stairs-Rails: None Home Layout: One level     Bathroom Shower/Tub: Walk-in shower (has tubshower too if needed)   Firefighter: Handicapped height     Home Equipment: Merchant navy officer;Shower Environmental education officer Equipment: Reacher Additional Comments: has a friend that will loan her 3n1 if needed. 2 dogs Charlie and Buddy,      Prior Functioning/Environment Prior Level of Function : Independent/Modified Independent               ADLs Comments: sleeping on the couch and sitting on shower seat due to weakness bil LE        OT Problem List:        OT Treatment/Interventions:      OT Goals(Current goals can be found in the care plan section) Acute Rehab OT Goals Patient Stated Goal: to return home  OT Frequency:      Co-evaluation              AM-PAC OT "6 Clicks" Daily Activity     Outcome Measure Help from another person eating meals?: None Help from another person taking care of personal grooming?: None Help from another person  toileting, which includes using toliet, bedpan, or urinal?: A Little Help from another person bathing (including washing, rinsing, drying)?: A Little Help from another person to put on and taking off regular upper body clothing?: None Help from another person to put on and taking off regular lower body clothing?: A Little 6 Click Score: 21   End of Session Equipment Utilized During Treatment: Rolling walker (2 wheels);Back brace Nurse Communication: Mobility status;Precautions  Activity Tolerance: Patient tolerated treatment well Patient left: in bed;with call bell/phone within reach  OT Visit Diagnosis: Unsteadiness on feet (R26.81)                Time: 1610-9604 OT Time Calculation (min): 46 min Charges:  OT General Charges $OT Visit: 1 Visit OT Evaluation $OT Eval Moderate Complexity: 1 Mod OT Treatments $Self Care/Home Management : 23-37 mins   Brynn, OTR/L  Acute Rehabilitation Services Pager: [phone] Office: [phone] .   Mateo Flow 09/09/2021, 9:26 AM

## 2021-09-12 ENCOUNTER — Encounter (HOSPITAL_COMMUNITY): Payer: Self-pay | Admitting: Orthopedic Surgery

## 2021-09-12 NOTE — Discharge Summary (Signed)
Patient ID: HAEVYN URY MRN: [id number] DOB/AGE: 05-Sep-1955 66 y.o.  Admit date: 09/08/2021 Discharge date: 09/09/2021  Admission Diagnoses:  Principal Problem:   Radiculopathy   Discharge Diagnoses:  Same  Past Medical History:  Diagnosis Date   ADHD (attention deficit hyperactivity disorder) 2015   Anxiety 2000   Depression    Heart murmur    diagnosed in her 20's, "hasn't been heard lately"   Hyperthyroidism 2000   per pt, "it corrected itself"   Migraine     Surgeries: Procedure(s): RIGHT-SIDED LUMBAR 3 - LUMBAR 4, LUMBAR 4- LUMBAR 5 TRANSFORAMINAL LUMBAR INTERBODY FUSION WITH INSTRUMENTATION AND ALLOGRAFT on 09/08/2021   Consultants: none  Discharged Condition: Improved  Hospital Course: Michelle Donaldson is an 66 y.o. female who was admitted 09/08/2021 for operative treatment of Radiculopathy. Patient has severe unremitting pain that affects sleep, daily activities, and work/hobbies. After pre-op clearance the patient was taken to the operating room on 09/08/2021 and underwent  Procedure(s): RIGHT-SIDED LUMBAR 3 - LUMBAR 4, LUMBAR 4- LUMBAR 5 TRANSFORAMINAL LUMBAR INTERBODY FUSION WITH INSTRUMENTATION AND ALLOGRAFT.    Patient was given perioperative antibiotics:  Anti-infectives (From admission, onward)    Start     Dose/Rate Route Frequency Ordered Stop   09/08/21 2000  ceFAZolin (ANCEF) IVPB 2g/100 mL premix        2 g 200 mL/hr over 30 Minutes Intravenous Every 8 hours 09/08/21 1537 09/09/21 0434   09/08/21 0600  ceFAZolin (ANCEF) IVPB 2g/100 mL premix        2 g 200 mL/hr over 30 Minutes Intravenous On call to O.R. 09/08/21 0548 09/08/21 1130        Patient was given sequential compression devices, early ambulation to prevent DVT.  Patient benefited maximally from hospital stay and there were no complications.    Recent vital signs: BP 115/71    Pulse 81    Temp (!) 78 F (25.6 C)    Resp 18    Ht 5' 3.5" (1.613 m)    Wt 87.3 kg    SpO2 91%    BMI  33.56 kg/m    Discharge Medications:   Allergies as of 09/09/2021       Reactions   Latex    Skin irritation         Medication List     STOP taking these medications    acetaminophen-codeine 300-30 MG tablet Commonly known as: TYLENOL #3   busPIRone 15 MG tablet Commonly known as: BUSPAR       TAKE these medications    acetaminophen 500 MG tablet Commonly known as: TYLENOL Take 1,000 mg by mouth every 6 (six) hours as needed for mild pain.   ALPRAZolam 0.5 MG tablet Commonly known as: XANAX Take 0.5 mg by mouth daily as needed for anxiety.   b complex vitamins tablet Take 1 tablet by mouth daily.   ELDERBERRY PO Take 1 capsule by mouth daily.   gabapentin 300 MG capsule Commonly known as: NEURONTIN Take 300 mg by mouth at bedtime as needed for pain.   glucosamine-chondroitin 500-400 MG tablet Take 1 tablet by mouth daily.   methocarbamol 500 MG tablet Commonly known as: ROBAXIN Take 1 tablet (500 mg total) by mouth every 6 (six) hours as needed for muscle spasms.   multivitamin with minerals Tabs tablet Take 1 tablet by mouth daily.   oxyCODONE-acetaminophen 7.5-325 MG tablet Commonly known as: PERCOCET Take 1 tablet by mouth every 4 (four) hours as needed  for moderate pain (pain).   rosuvastatin 5 MG tablet Commonly known as: CRESTOR Take 5 mg by mouth 2 (two) times a week.   sertraline 50 MG tablet Commonly known as: ZOLOFT Take 50-75 mg by mouth daily.   traZODone 50 MG tablet Commonly known as: DESYREL Take 50-100 mg by mouth at bedtime.   vitamin B-12 1000 MCG tablet Commonly known as: CYANOCOBALAMIN Take 1,000 mcg by mouth daily.   Vyvanse 50 MG capsule Generic drug: lisdexamfetamine Take 50 mg by mouth See admin instructions. 50 mg daily on weekdays   Yuvafem 10 MCG Tabs vaginal tablet Generic drug: Estradiol Place 10 mcg vaginally 2 (two) times a week. Sunday night and Wednesday night        Diagnostic Studies: DG Lumbar  Spine 2-3 Views  Result Date: 09/08/2021 CLINICAL DATA:  Lumbar fusion EXAM: LUMBAR SPINE - 2-3 VIEW COMPARISON:  Earlier films of the same day FINDINGS: AP and lateral fluoroscopic spot images document changes of instrumented TLIF L3-4 and L4-5. Hardware projects in expected location. Alignment appears preserved. IMPRESSION: L3-L5 TLIF Electronically Signed   By: Corlis Leak M.D.   On: 09/08/2021 12:21   DG Lumbar Spine 1 View  Result Date: 09/08/2021 CLINICAL DATA:  Intraoperative image for localization. Surgery, elective Z41.9 (ICD-10-CM) EXAM: LUMBAR SPINE - 1 VIEW COMPARISON:  MRI lumbar spine June 30, 2021. FINDINGS: Single intraoperative cross-table radiograph. This image demonstrates 2 localizing needles in the posterior paraspinal soft tissues. The superior needle projects at the lower L3 vertebral body level. The more inferior needle projects at the L4-L5 level. Multilevel degenerative change better characterized on prior MRI. IMPRESSION: Intraoperative radiograph for localization, as detailed above. Electronically Signed   By: Feliberto Harts M.D.   On: 09/08/2021 08:59   DG C-Arm 1-60 Min-No Report  Result Date: 09/08/2021 Fluoroscopy was utilized by the requesting physician.  No radiographic interpretation.   DG C-Arm 1-60 Min-No Report  Result Date: 09/08/2021 Fluoroscopy was utilized by the requesting physician.  No radiographic interpretation.   DG C-Arm 1-60 Min-No Report  Result Date: 09/08/2021 Fluoroscopy was utilized by the requesting physician.  No radiographic interpretation.   DG C-Arm 1-60 Min-No Report  Result Date: 09/08/2021 Fluoroscopy was utilized by the requesting physician.  No radiographic interpretation.    Disposition: Discharge disposition: 01-Home or Self Care        POD #1 s/p fusion, previously complaining of significant back pain and right LE numbness, which has improved   - etiology of this is unclear, as surgery was very uneventful, notable  for a successful decompression, however this has improved a lot - up with PT/OT - Percocet for pain, Robaxin for muscle spasms -Scripts for pain sent to pharmacy electronically  -D/C instructions sheet printed and in chart -D/C today  -F/U in office 2 weeks   Signed: Georga Bora 09/12/2021, 7:57 PM

## 2021-09-13 MED FILL — Sodium Chloride IV Soln 0.9%: INTRAVENOUS | Qty: 1000 | Status: AC

## 2021-09-13 MED FILL — Heparin Sodium (Porcine) Inj 1000 Unit/ML: INTRAMUSCULAR | Qty: 30 | Status: AC

## 2021-12-06 DIAGNOSIS — M5416 Radiculopathy, lumbar region: Secondary | ICD-10-CM | POA: Diagnosis not present

## 2021-12-06 DIAGNOSIS — Z9889 Other specified postprocedural states: Secondary | ICD-10-CM | POA: Diagnosis not present

## 2021-12-20 DIAGNOSIS — M6281 Muscle weakness (generalized): Secondary | ICD-10-CM | POA: Diagnosis not present

## 2021-12-20 DIAGNOSIS — M4326 Fusion of spine, lumbar region: Secondary | ICD-10-CM | POA: Diagnosis not present

## 2022-01-03 DIAGNOSIS — M4326 Fusion of spine, lumbar region: Secondary | ICD-10-CM | POA: Diagnosis not present

## 2022-01-03 DIAGNOSIS — M6281 Muscle weakness (generalized): Secondary | ICD-10-CM | POA: Diagnosis not present

## 2022-01-05 DIAGNOSIS — M6281 Muscle weakness (generalized): Secondary | ICD-10-CM | POA: Diagnosis not present

## 2022-01-05 DIAGNOSIS — M4326 Fusion of spine, lumbar region: Secondary | ICD-10-CM | POA: Diagnosis not present

## 2022-01-18 DIAGNOSIS — M6281 Muscle weakness (generalized): Secondary | ICD-10-CM | POA: Diagnosis not present

## 2022-01-18 DIAGNOSIS — M4326 Fusion of spine, lumbar region: Secondary | ICD-10-CM | POA: Diagnosis not present

## 2022-06-16 DIAGNOSIS — F331 Major depressive disorder, recurrent, moderate: Secondary | ICD-10-CM | POA: Diagnosis not present

## 2022-06-16 DIAGNOSIS — R69 Illness, unspecified: Secondary | ICD-10-CM | POA: Diagnosis not present

## 2022-06-16 DIAGNOSIS — R41840 Attention and concentration deficit: Secondary | ICD-10-CM | POA: Diagnosis not present

## 2022-07-04 ENCOUNTER — Other Ambulatory Visit: Payer: Self-pay | Admitting: Orthopaedic Surgery

## 2022-07-04 DIAGNOSIS — M79604 Pain in right leg: Secondary | ICD-10-CM

## 2022-07-04 DIAGNOSIS — G8929 Other chronic pain: Secondary | ICD-10-CM

## 2022-11-16 IMAGING — RF DG LUMBAR SPINE 2-3V
1 series · 2 of 2 positions shown · non-contrast
Comparison: Earlier films of the same day

CLINICAL DATA: Lumbar fusion

EXAM:
LUMBAR SPINE - 2-3 VIEW

[Series 1: run · 2 of 2 slices shown]
[im 1/2]
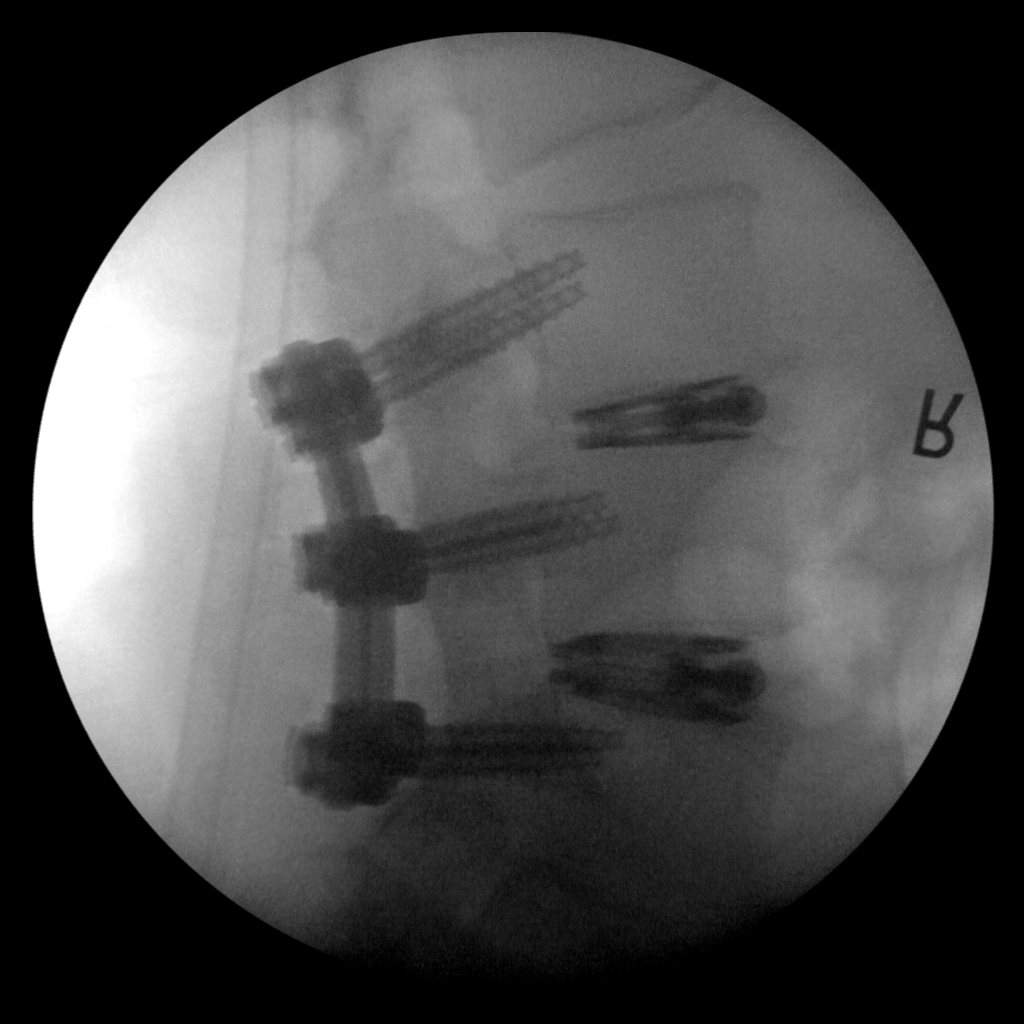
[im 2/2]
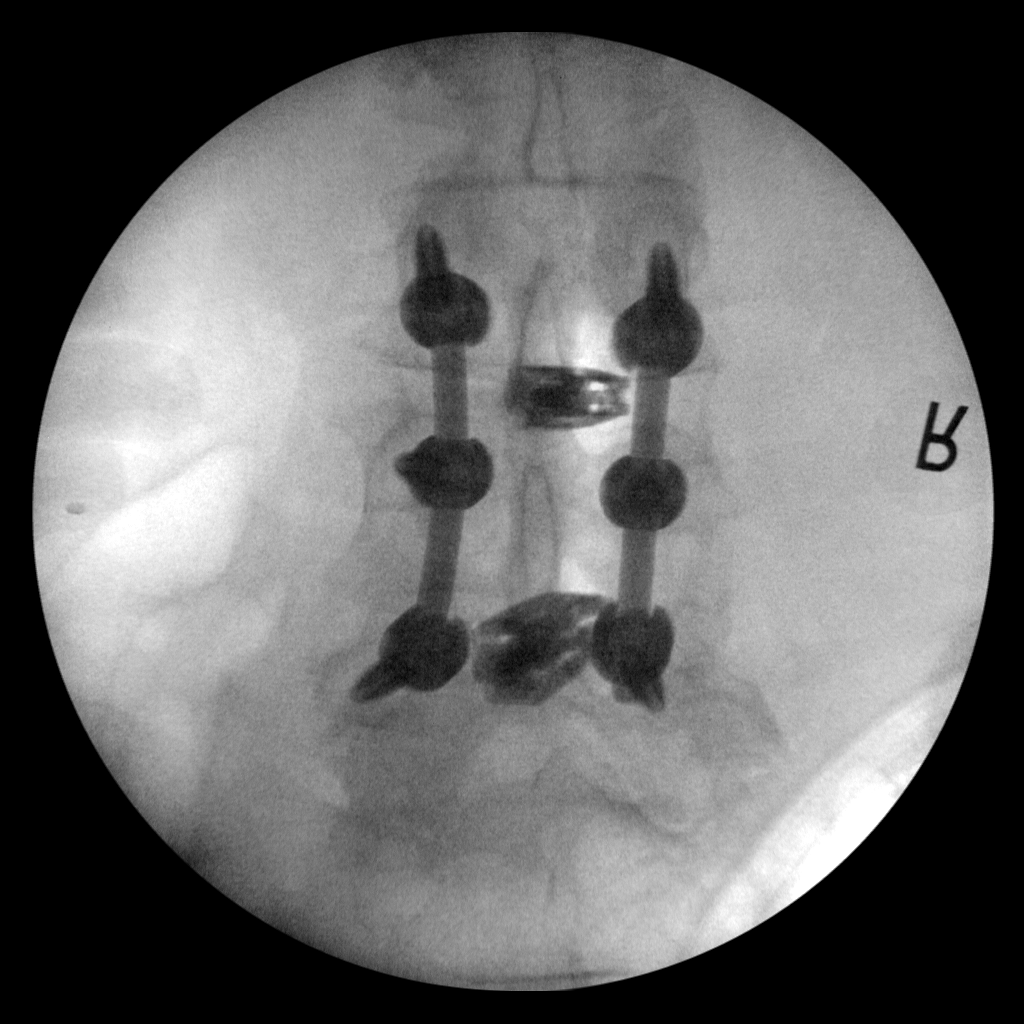

[2 of 2 positions shown; findings below may reference images not displayed]

FINDINGS: AP and lateral fluoroscopic spot images document changes of
instrumented TLIF L3-4 and L4-5. Hardware projects in expected
location. Alignment appears preserved.
IMPRESSION: L3-L5 TLIF

## 2023-02-13 ENCOUNTER — Institutional Professional Consult (permissible substitution): Payer: POS | Admitting: Pulmonary Disease

## 2023-02-16 ENCOUNTER — Ambulatory Visit: Payer: 59 | Admitting: Podiatry

## 2023-02-27 ENCOUNTER — Ambulatory Visit (INDEPENDENT_AMBULATORY_CARE_PROVIDER_SITE_OTHER): Payer: 59 | Admitting: Podiatry

## 2023-02-27 ENCOUNTER — Ambulatory Visit (INDEPENDENT_AMBULATORY_CARE_PROVIDER_SITE_OTHER): Payer: 59

## 2023-02-27 ENCOUNTER — Other Ambulatory Visit: Payer: Self-pay | Admitting: Podiatry

## 2023-02-27 DIAGNOSIS — M7662 Achilles tendinitis, left leg: Secondary | ICD-10-CM

## 2023-02-27 DIAGNOSIS — M79672 Pain in left foot: Secondary | ICD-10-CM | POA: Diagnosis not present

## 2023-02-27 DIAGNOSIS — M722 Plantar fascial fibromatosis: Secondary | ICD-10-CM

## 2023-02-27 DIAGNOSIS — M7732 Calcaneal spur, left foot: Secondary | ICD-10-CM

## 2023-02-27 MED ORDER — TRIAMCINOLONE ACETONIDE 10 MG/ML IJ SUSP
10.0000 mg | Freq: Once | INTRAMUSCULAR | Status: AC
  Administered 2023-02-27: 10 mg

## 2023-02-27 NOTE — Progress Notes (Signed)
Chief Complaint  Patient presents with   Foot Pain    Pt states her heel hurts, the pain started about a month or month and ah half ago. Hurts more if it is touching something or if she is walking to much pain feels like throbbing and stabbing     HPI: 67 y.o. female presenting today with c/o pain in the back of the left heel.  Patient states the pain has been going on for approximately 2 months now and is getting worse.  Denies trauma or feeling a snap or pop sensation.  Denies bruising to the area.  Denies weakness during propulsion when walking  Past Medical History:  Diagnosis Date   ADHD (attention deficit hyperactivity disorder) 2015   Anxiety 2000   Depression    Heart murmur    diagnosed in her 20's, "hasn't been heard lately"   Hyperthyroidism 2000   per pt, "it corrected itself"   Migraine     Past Surgical History:  Procedure Laterality Date   ABDOMINAL HYSTERECTOMY  1986   partial   CESAREAN SECTION     x2   ROTATOR CUFF REPAIR     TOE SURGERY Left 2019   also "around 1980's had surgery on both feet" (to correct bone spur)   TRANSFORAMINAL LUMBAR INTERBODY FUSION (TLIF) WITH PEDICLE SCREW FIXATION 2 LEVEL Right 09/08/2021   Procedure: RIGHT-SIDED LUMBAR 3 - LUMBAR 4, LUMBAR 4- LUMBAR 5 TRANSFORAMINAL LUMBAR INTERBODY FUSION WITH INSTRUMENTATION AND ALLOGRAFT;  Surgeon: Estill Bamberg, MD;  Location: [facility] OR;  Service: Orthopedics;  Laterality: Right;    Allergies  Allergen Reactions   Latex     Skin irritation      Physical Exam: General: The patient is alert and oriented x3 in no acute distress.  Dermatology:  No ecchymosis or erythema bilateral.  No open lesions.    Vascular: Palpable pedal pulses bilaterally. Capillary refill within normal limits.      Neurological: Light touch sensation intact bilateral.  MMT 5/5 to lower extremity bilateral.   Musculoskeletal Exam:  There is pain on palpation of the posterior aspect of the left heel at the  Achilles insertion. no palpable gaps or nodules noted within the achilles tendon.  Antalgic gait noted with first steps out of exam chair.  No pain on palpation of the plantar heel.  No ecchymosis noted.  Minimal localized edema to the posterior left heel at the Achilles insertion.  No erythema  Radiographic Exam (left heel, 2 weightbearing views, 02/27/2023):  Lateral and calcaneal views obtained of the left foot reveal normal osseous mineralization. Joint spaces preserved.  No fractures noted.  There is a posterior calcaneal spur noted  Assessment/Plan of Care: 1. Tendonitis, Achilles, left   2. Pain of left heel   3. Posterior calcaneal spur of left foot     DG FOOT 2 VIEWS LEFT -reviewed findings with patient today  -Reviewed etiology of achilles tendonitis with patient.  Discussed treatment options with patient today, including cortisone injection, NSAID course of treatment, stretching exercises, use of night splint, physical therapy, rest, icing the heel, arch supports/orthotics, and supportive shoe gear.    With the patient's consent a corticosteroid injection was administered to the posterior left heel at the Achilles tendon insertion consisting of 1% lidocaine plain, 0.5% Sensorcaine plain, and Kenalog 10 for a total of 1 cc administered.  She tolerated this fairly well.  A Band-Aid was applied.  She will remove this later today.  She was instructed to avoid high impact activities over the next 24 to 36 hours.  Reviewed Achilles and calf stretching exercises to perform daily.  Patient fitted for a prefabricated night splint.  This is a static AFO with a soft interface material meant to be used when nonweightbearing during sleeping hours.  She was instructed to remove this in the morning before ambulating.  She was also instructed to purchase over-the-counter Dr. Margart Sickles gel heel cups/cushions as this will create a slight wedge for the heels to decrease pull of the Achilles tendon.  She  was also informed that any shoe with an elevated heel or a wedge sandal could also provide some relief during weightbearing.  Return in about 4 weeks (around 03/27/2023) for left achilles recheck.   Clerance Lav, DPM, FACFAS [facility]     [address] [place], [place] [address]                Office [phone]  Fax [phone]

## 2023-03-14 ENCOUNTER — Ambulatory Visit (INDEPENDENT_AMBULATORY_CARE_PROVIDER_SITE_OTHER): Payer: Self-pay | Admitting: Pulmonary Disease

## 2023-03-14 ENCOUNTER — Ambulatory Visit (INDEPENDENT_AMBULATORY_CARE_PROVIDER_SITE_OTHER): Payer: Self-pay

## 2023-03-14 ENCOUNTER — Encounter (HOSPITAL_BASED_OUTPATIENT_CLINIC_OR_DEPARTMENT_OTHER): Payer: Self-pay | Admitting: Pulmonary Disease

## 2023-03-14 VITALS — BP 130/90 | HR 81 | Temp 98.1°F | Ht 63.5 in | Wt 199.2 lb

## 2023-03-14 DIAGNOSIS — R053 Chronic cough: Secondary | ICD-10-CM

## 2023-03-14 DIAGNOSIS — R059 Cough, unspecified: Secondary | ICD-10-CM

## 2023-03-14 MED ORDER — ALBUTEROL SULFATE HFA 108 (90 BASE) MCG/ACT IN AERS: 2.0000 | INHALATION_SPRAY | RESPIRATORY_TRACT | 2 refills | Status: DC | PRN

## 2023-03-14 MED ORDER — OMEPRAZOLE 20 MG PO CPDR: 20.0000 mg | DELAYED_RELEASE_CAPSULE | Freq: Every day | ORAL | 1 refills | Status: DC

## 2023-03-14 NOTE — Patient Instructions (Addendum)
Chronic cough --Labs ordered: CBC with diff and IgE --START Albuterol TWO puffs in the morning and evening.   >Do not take two days leading up to your breathing tests (PFTs) --SCHEDULE pulmonary function tests as soon as available --START omeprazole 20 mg daily

## 2023-03-14 NOTE — Progress Notes (Signed)
Subjective:   PATIENT ID: Michelle Donaldson GENDER: female DOB: 05-Sep-1955, MRN: [id number]  Chief Complaint  Patient presents with   Consult    Patient stated her cough seems to be really deep like it comes from the gut, had the cough for a year, non productive cough.     Reason for Visit: New consult for chronic cough  Ms. Michelle Donaldson is a 67 year old female never smoker with seasonal allergic rhinitis, hx deviated septum and GERD who presents for evaluation for chronic cough.  She reports long standing cough that seems to have been more noticeable for the last a year. She reports the cough is a deep guttural cough, not usually associated with sputum. No wheezing. Has shortness of breath with exercise or yardwork or walking upstairs. Able to walk flat distances. Denies nocturnal symptoms. No known triggers. Cough does not seem to be associated with weather or activity. 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 But cough doesn't really change in other environments. Has not tried inhalers. Denies reflux however heartburn occurs after big meals treated with tums once a year. Denies allergies or sinus issues.   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  Past Medical History:  Diagnosis Date   ADHD (attention deficit hyperactivity disorder) 2015   Anxiety 2000   Depression    Heart murmur    diagnosed in her 20's, "hasn't been heard lately"   Hyperthyroidism 2000   per pt, "it corrected itself"   Migraine      No family history on file.   Social History   Occupational History   Not on file  Tobacco Use   Smoking status: Never   Smokeless tobacco: Never  Vaping Use   Vaping Use: Never used  Substance and Sexual Activity   Alcohol use: Yes    Comment: pt states  maybe one drink a month   Drug use: Never   Sexual activity: Not on file    Allergies  Allergen Reactions   Latex     Skin irritation      Outpatient Medications Prior to Visit  Medication Sig Dispense Refill   acetaminophen (TYLENOL) 500 MG tablet Take 1,000 mg by mouth every 6 (six) hours as needed for mild pain.     ALPRAZolam (XANAX) 0.5 MG tablet Take 0.5 mg by mouth daily as needed for anxiety.     b complex vitamins tablet Take 1 tablet by mouth daily.     busPIRone (BUSPAR) 15 MG tablet Take 15 mg by mouth 2 (two) times daily.     ELDERBERRY PO Take 1 capsule by mouth daily.     gabapentin (NEURONTIN) 300 MG capsule Take 300 mg by mouth at bedtime as needed for pain.     glucosamine-chondroitin 500-400 MG tablet Take 1 tablet by mouth daily.     lamoTRIgine (LAMICTAL) 150 MG tablet TAKE 1/2 TAB EVERY AM AND 1/2 TAB AT BEDTIME (CALL PROVIDER IF RASH APPEARS)     methocarbamol (ROBAXIN) 500 MG tablet Take 1 tablet (500 mg total) by mouth every 6 (six) hours as needed for muscle spasms. 30 tablet 2   Multiple Vitamin (MULTIVITAMIN WITH MINERALS) TABS tablet Take 1 tablet by mouth daily.     oxyCODONE-acetaminophen (PERCOCET) 7.5-325 MG tablet Take 1 tablet by mouth every 4 (four)  hours as needed for moderate pain (pain). 30 tablet 0   rosuvastatin (CRESTOR) 5 MG tablet Take 5 mg by mouth 2 (two) times a week.     sertraline (ZOLOFT) 50 MG tablet Take 50-75 mg by mouth daily.     traZODone (DESYREL) 50 MG tablet Take 50-100 mg by mouth at bedtime.     vitamin B-12 (CYANOCOBALAMIN) 1000 MCG tablet Take 1,000 mcg by mouth daily.     VYVANSE 50 MG capsule Take 50 mg by mouth See admin instructions. 50 mg daily on weekdays     YUVAFEM 10 MCG TABS vaginal tablet Place 10 mcg vaginally 2 (two) times a week. Sunday night and Wednesday night     No facility-administered medications prior to visit.    Review of Systems  Constitutional:  Negative for chills, diaphoresis, fever,  malaise/fatigue and weight loss.  HENT:  Negative for congestion.   Respiratory:  Positive for cough. Negative for hemoptysis, sputum production, shortness of breath and wheezing.   Cardiovascular:  Negative for chest pain, palpitations and leg swelling.  Gastrointestinal:  Positive for heartburn. Negative for abdominal pain and nausea.     Objective:   Vitals:   03/14/23 1415  BP: (!) 130/90  Pulse: 81  Temp: 98.1 F (36.7 C)  TempSrc: Oral  SpO2: 96%  Weight: 199 lb 3.2 oz (90.4 kg)  Height: 5' 3.5" (1.613 m)   SpO2: 96 % O2 Device: None (Room air)  Physical Exam: General: Well-appearing, no acute distress HENT: [place], AT Eyes: EOMI, no scleral icterus Respiratory: Clear to auscultation bilaterally.  No crackles, wheezing or rales Cardiovascular: RRR, -M/R/G, no JVD Extremities:-Edema,-tenderness Neuro: AAO x4, CNII-XII grossly intact Psych: Normal mood, normal affect  Data Reviewed:  Imaging: CTA CAP 01/04/21 - Visualized lung parenchyma with no pulmonary nodules, masses, infiltrate, effusion or pneumothorax.  CXR 03/14/23 - No acute infiltrate effusion or edema. Minimal bronchitic changes suggestive of chronic bronchitis. Personally interpreted by me  PFT: None on file  Labs: CBC    Component Value Date/Time   WBC 7.1 08/31/2021 1501   RBC 4.63 08/31/2021 1501   HGB 13.3 08/31/2021 1501   HCT 40.8 08/31/2021 1501   PLT 272 08/31/2021 1501   MCV 88.1 08/31/2021 1501   MCH 28.7 08/31/2021 1501   MCHC 32.6 08/31/2021 1501   RDW 13.1 08/31/2021 1501   LYMPHSABS 2.5 08/31/2021 1501   MONOABS 0.7 08/31/2021 1501   EOSABS 0.3 08/31/2021 1501   BASOSABS 0.0 08/31/2021 1501   Absolute eos 08/31/21 - 300     Assessment & Plan:   Discussion: 67 year old female never smoker with seasonal allergic rhinitis, hx deviated septum and GERD who presents for evaluation for chronic cough. Consider cough variant asthma +/- GERD though presentation is atypical. May be  silent aspiration? However normal swallow per patient. If work-up negative may consider formal swallow evaluation.  Chronic cough --Labs ordered: CBC with diff and IgE --START Albuterol TWO puffs in the morning and evening.   >Do not take two days leading up to your breathing tests (PFTs) --SCHEDULED pulmonary function tests --START omeprazole 20 mg daily   Health Maintenance Immunization History  Administered Date(s) Administered   Tdap 10/08/2016   Zoster, Live 05/05/2020   CT Lung Screen - never smoker. Not qualified  Orders Placed This Encounter  Procedures   DG Chest 2 View    Standing Status:   Future    Standing Expiration Date:   03/13/2024    Order  Specific Question:   Reason for Exam (SYMPTOM  OR DIAGNOSIS REQUIRED)    Answer:   cough    Order Specific Question:   Preferred imaging location?    Answer:   [facility]  No orders of the defined types were placed in this encounter.   Return for after PFT. At market if available.  I have spent a total time of 45-minutes on the day of the appointment reviewing prior documentation, coordinating care and discussing medical diagnosis and plan with the patient/family. Imaging, labs and tests included in this note have been reviewed and interpreted independently by me.  Neko Mcgeehan Mechele Collin, MD [facility] 03/14/2023 2:24 PM  Office Number [phone]

## 2023-03-20 NOTE — Addendum Note (Signed)
Addended by: Luciano Cutter on: 03/20/2023 11:53 AM   Modules accepted: Orders

## 2023-03-26 ENCOUNTER — Ambulatory Visit (INDEPENDENT_AMBULATORY_CARE_PROVIDER_SITE_OTHER): Payer: Self-pay | Admitting: Podiatry

## 2023-03-26 DIAGNOSIS — M7662 Achilles tendinitis, left leg: Secondary | ICD-10-CM | POA: Diagnosis not present

## 2023-03-26 NOTE — Progress Notes (Unsigned)
      Chief Complaint  Patient presents with   Tendonitis    Pt states she is having no more pain she feels much better    HPI: 67 y.o. female presenting today for follow-up of left Achilles tendinitis.  She states that she is doing much better and feels that her symptoms are almost completely gone at this time.  She is pleased with how everything is going.  She did well with the cortisone injection at her last visit and has had no residual issues from that.  She did not get the heel cups stating that she forgot about them.  She notes that she could not tolerate the night splint and was not able to sleep while wearing it.  She is performing her stretching exercises daily  Past Medical History:  Diagnosis Date   ADHD (attention deficit hyperactivity disorder) 2015   Anxiety 2000   Depression    Heart murmur    diagnosed in her 20's, "hasn't been heard lately"   Hyperthyroidism 2000   per pt, "it corrected itself"   Migraine     Past Surgical History:  Procedure Laterality Date   ABDOMINAL HYSTERECTOMY  1986   partial   CESAREAN SECTION     x2   ROTATOR CUFF REPAIR     TOE SURGERY Left 2019   also "around 1980's had surgery on both feet" (to correct bone spur)   TRANSFORAMINAL LUMBAR INTERBODY FUSION (TLIF) WITH PEDICLE SCREW FIXATION 2 LEVEL Right 09/08/2021   Procedure: RIGHT-SIDED LUMBAR 3 - LUMBAR 4, LUMBAR 4- LUMBAR 5 TRANSFORAMINAL LUMBAR INTERBODY FUSION WITH INSTRUMENTATION AND ALLOGRAFT;  Surgeon: Estill Bamberg, MD;  Location: [facility] OR;  Service: Orthopedics;  Laterality: Right;    Allergies  Allergen Reactions   Latex     Skin irritation     Physical Exam: General: The patient is alert and oriented x3 in no acute distress.  Dermatology:  No ecchymosis, erythema, or edema bilateral.  No open lesions.    Vascular: Palpable pedal pulses bilaterally. Capillary refill within normal limits.  No appreciable edema.    Musculoskeletal Exam:  There is minimal pain on palpation  of the posterior aspect of the left heel.  No palpable gaps or nodules noted within the achilles tendon.  Patient has normal gait at this time.  Assessment/Plan of Care: 1. Tendonitis, Achilles, left    Patient will continue to perform her Stretching exercises daily and then slowly cut back as long as she is not having any return of symptoms.  She may try to find a dorsal night splint on [employer] since the posterior night splint was too uncomfortable for her to be able to sleep while wearing.  Reviewed shoe gear and activities which may exacerbate her symptoms again.  Return if symptoms worsen or fail to improve.   Clerance Lav, DPM, FACFAS [facility]     [address] [place], [place] [address]                Office [phone]  Fax [phone]

## 2023-05-07 ENCOUNTER — Other Ambulatory Visit (HOSPITAL_BASED_OUTPATIENT_CLINIC_OR_DEPARTMENT_OTHER): Payer: Self-pay | Admitting: Pulmonary Disease

## 2023-06-08 ENCOUNTER — Other Ambulatory Visit (HOSPITAL_BASED_OUTPATIENT_CLINIC_OR_DEPARTMENT_OTHER): Payer: Self-pay | Admitting: Pulmonary Disease

## 2023-09-20 ENCOUNTER — Other Ambulatory Visit (HOSPITAL_BASED_OUTPATIENT_CLINIC_OR_DEPARTMENT_OTHER): Payer: Self-pay | Admitting: Pulmonary Disease

## 2023-12-20 ENCOUNTER — Other Ambulatory Visit (HOSPITAL_BASED_OUTPATIENT_CLINIC_OR_DEPARTMENT_OTHER): Payer: Self-pay | Admitting: Pulmonary Disease

## 2024-01-27 ENCOUNTER — Emergency Department (HOSPITAL_BASED_OUTPATIENT_CLINIC_OR_DEPARTMENT_OTHER)
Admission: EM | Admit: 2024-01-27 | Discharge: 2024-01-27 | Disposition: A | Discharge status: Home / Self Care | Attending: Emergency Medicine | Admitting: Emergency Medicine

## 2024-01-27 ENCOUNTER — Encounter (HOSPITAL_BASED_OUTPATIENT_CLINIC_OR_DEPARTMENT_OTHER): Payer: Self-pay

## 2024-01-27 ENCOUNTER — Emergency Department (HOSPITAL_BASED_OUTPATIENT_CLINIC_OR_DEPARTMENT_OTHER)

## 2024-01-27 ENCOUNTER — Other Ambulatory Visit: Payer: Self-pay

## 2024-01-27 DIAGNOSIS — R21 Rash and other nonspecific skin eruption: Secondary | ICD-10-CM | POA: Diagnosis present

## 2024-01-27 DIAGNOSIS — Z91040 Latex allergy status: Secondary | ICD-10-CM | POA: Insufficient documentation

## 2024-01-27 DIAGNOSIS — L239 Allergic contact dermatitis, unspecified cause: Secondary | ICD-10-CM | POA: Insufficient documentation

## 2024-01-27 DIAGNOSIS — E039 Hypothyroidism, unspecified: Secondary | ICD-10-CM | POA: Diagnosis not present

## 2024-01-27 DIAGNOSIS — Z79899 Other long term (current) drug therapy: Secondary | ICD-10-CM | POA: Diagnosis not present

## 2024-01-27 MED ORDER — PREDNISONE 50 MG PO TABS
60.0000 mg | ORAL_TABLET | Freq: Once | ORAL | Status: AC
  Administered 2024-01-27: 60 mg via ORAL
  Filled 2024-01-27: qty 1

## 2024-01-27 MED ORDER — PREDNISONE 10 MG PO TABS: 40.0000 mg | ORAL_TABLET | Freq: Every day | ORAL | 0 refills | Status: AC

## 2024-01-27 NOTE — ED Provider Notes (Signed)
 [place] [facility] Provider Note   CSN: [id number] Arrival date & time: 01/27/24  2130     History  Chief Complaint  Patient presents with   Rash    Michelle Donaldson is a 68 y.o. female.  Patient was clearing brush yesterday.  By late afternoon started having some itching.  Patient now with rash on face upper extremities lower extremities some on the body.  Patient 1 time had significant contact dermatitis from poison ivy as a child.  No systemic symptoms.  Patient was trying over-the-counter remedies without much help.  Past medical history significant for hypothyroidism history of migraines.  She has never used tobacco products.       Home Medications Prior to Admission medications   Medication Sig Start Date End Date Taking? Authorizing Provider  predniSONE (DELTASONE) 10 MG tablet Take 4 tablets (40 mg total) by mouth daily. 01/27/24  Yes Domanick Cuccia, MD  acetaminophen  (TYLENOL ) 500 MG tablet Take 1,000 mg by mouth every 6 (six) hours as needed for mild pain.    [provider]  albuterol  (VENTOLIN  HFA) 108 (90 Base) MCG/ACT inhaler Inhale 2 puffs into the lungs every 4 (four) hours as needed for wheezing or shortness of breath. MUST SCHEDULE VISIT FOR REFILLS 09/21/23   Quillian Brunt, MD  ALPRAZolam  (XANAX ) 0.5 MG tablet Take 0.5 mg by mouth daily as needed for anxiety. 08/04/21   [provider]  b complex vitamins tablet Take 1 tablet by mouth daily.    [provider]  busPIRone  (BUSPAR ) 15 MG tablet Take 15 mg by mouth 2 (two) times daily.    [provider]  ELDERBERRY PO Take 1 capsule by mouth daily.    [provider]  gabapentin  (NEURONTIN ) 300 MG capsule Take 300 mg by mouth at bedtime as needed for pain. 08/15/21   [provider]  glucosamine-chondroitin 500-400 MG tablet Take 1 tablet by mouth daily.    [provider]  lamoTRIgine (LAMICTAL) 150 MG tablet  TAKE 1/2 TAB EVERY AM AND 1/2 TAB AT BEDTIME (CALL PROVIDER IF RASH APPEARS)    [provider]  methocarbamol  (ROBAXIN ) 500 MG tablet Take 1 tablet (500 mg total) by mouth every 6 (six) hours as needed for muscle spasms. 09/09/21   Virl Grimes, MD  Multiple Vitamin (MULTIVITAMIN WITH MINERALS) TABS tablet Take 1 tablet by mouth daily.    [provider]  omeprazole  (PRILOSEC) 20 MG capsule TAKE 1 CAPSULE BY MOUTH EVERY DAY 12/22/23   Quillian Brunt, MD  oxyCODONE -acetaminophen  (PERCOCET) 7.5-325 MG tablet Take 1 tablet by mouth every 4 (four) hours as needed for moderate pain (pain). 09/09/21   Virl Grimes, MD  rosuvastatin  (CRESTOR ) 5 MG tablet Take 5 mg by mouth 2 (two) times a week. 07/20/21   [provider]  sertraline  (ZOLOFT ) 50 MG tablet Take 50-75 mg by mouth daily. 05/23/18   [provider]  traZODone  (DESYREL ) 50 MG tablet Take 50-100 mg by mouth at bedtime. 11/03/20   [provider]  vitamin B-12 (CYANOCOBALAMIN ) 1000 MCG tablet Take 1,000 mcg by mouth daily.    [provider]  VYVANSE  50 MG capsule Take 50 mg by mouth See admin instructions. 50 mg daily on weekdays 08/04/21   [provider]  YUVAFEM 10 MCG TABS vaginal tablet Place 10 mcg vaginally 2 (two) times a week. Sunday night and Wednesday night 11/15/20   [provider]  Allergies    Latex    Review of Systems   Review of Systems  Constitutional:  Negative for chills and fever.  HENT:  Negative for ear pain and sore throat.   Eyes:  Negative for pain and visual disturbance.  Respiratory:  Negative for cough and shortness of breath.   Cardiovascular:  Negative for chest pain and palpitations.  Gastrointestinal:  Negative for abdominal pain and vomiting.  Genitourinary:  Negative for dysuria and hematuria.  Musculoskeletal:  Negative for arthralgias and back pain.  Skin:  Positive for rash. Negative for color change.  Neurological:  Negative  for seizures and syncope.  All other systems reviewed and are negative.   Physical Exam Updated Vital Signs BP 126/78 (BP Location: Right Arm)   Pulse 65   Temp (!) 97.3 F (36.3 C) (Oral)   Resp 20   Ht 1.613 m (5' 3.5")   Wt 75.3 kg   SpO2 97%   BMI 28.95 kg/m  Physical Exam Vitals and nursing note reviewed.  Constitutional:      General: She is not in acute distress.    Appearance: Normal appearance. She is well-developed.  HENT:     Head: Normocephalic and atraumatic.  Eyes:     Extraocular Movements: Extraocular movements intact.     Conjunctiva/sclera: Conjunctivae normal.     Pupils: Pupils are equal, round, and reactive to light.  Cardiovascular:     Rate and Rhythm: Normal rate and regular rhythm.     Heart sounds: No murmur heard. Pulmonary:     Effort: Pulmonary effort is normal. No respiratory distress.     Breath sounds: Normal breath sounds.  Abdominal:     Palpations: Abdomen is soft.     Tenderness: There is no abdominal tenderness.  Musculoskeletal:        General: No swelling.     Cervical back: Neck supple.  Skin:    General: Skin is warm and dry.     Capillary Refill: Capillary refill takes less than 2 seconds.     Findings: Erythema and rash present.     Comments: Linear papular rash.  With erythema base.  No distinct vesicles.  But suggestive of a contact dermatitis.  Face upper extremities lower extremities some trunk.  Neurological:     Mental Status: She is alert.  Psychiatric:        Mood and Affect: Mood normal.     ED Results / Procedures / Treatments   Labs (all labs ordered are listed, but only abnormal results are displayed) Labs Reviewed - No data to display  EKG None  Radiology No results found.  Procedures Procedures    Medications Ordered in ED Medications  predniSONE (DELTASONE) tablet 60 mg (60 mg Oral Given 01/27/24 0906)    ED Course/ Medical Decision Making/ A&P                                 Medical  Decision Making Amount and/or Complexity of Data Reviewed Radiology: ordered.   Consistent with contact dermatitis from poison ivy.  Will treat with prednisone first dose here and then prednisone for the next 5 days.  Also recommend over-the-counter Benadryl .   Final Clinical Impression(s) / ED Diagnoses Final diagnoses:  Allergic contact dermatitis, unspecified trigger    Rx / DC Orders ED Discharge Orders          Ordered    predniSONE (DELTASONE)  10 MG tablet  Daily        01/27/24 0902              Abdullahi Vallone, MD 01/27/24 [phone]

## 2024-01-27 NOTE — ED Notes (Signed)
 ED Provider at bedside.

## 2024-01-27 NOTE — Discharge Instructions (Signed)
 Take the prednisone as directed.  Can also take over-the-counter Benadryl  25 to 50 mg every 6 hours to help with the itching.  The steroid will help a fair amount.  But probably will not completely resolve the rash at this point in time.  Start the prescription prednisone tomorrow.  And take as directed.  Follow-up with your doctor as needed.

## 2024-01-27 NOTE — ED Triage Notes (Signed)
 Patient reports body rash after working in the yard on 01/26/2024, rash noted to BUE, back, and chest w/o D/C noted.

## 2024-02-05 ENCOUNTER — Other Ambulatory Visit (HOSPITAL_BASED_OUTPATIENT_CLINIC_OR_DEPARTMENT_OTHER): Payer: Self-pay | Admitting: Pulmonary Disease

## 2024-03-03 ENCOUNTER — Other Ambulatory Visit (HOSPITAL_BASED_OUTPATIENT_CLINIC_OR_DEPARTMENT_OTHER): Payer: Self-pay | Admitting: Pulmonary Disease

## 2024-03-22 ENCOUNTER — Other Ambulatory Visit (HOSPITAL_BASED_OUTPATIENT_CLINIC_OR_DEPARTMENT_OTHER): Payer: Self-pay | Admitting: Pulmonary Disease

## 2024-03-25 ENCOUNTER — Other Ambulatory Visit (HOSPITAL_BASED_OUTPATIENT_CLINIC_OR_DEPARTMENT_OTHER): Payer: Self-pay | Admitting: Pulmonary Disease

## 2024-04-17 ENCOUNTER — Other Ambulatory Visit (HOSPITAL_BASED_OUTPATIENT_CLINIC_OR_DEPARTMENT_OTHER): Payer: Self-pay | Admitting: Pulmonary Disease

## 2024-08-14 ENCOUNTER — Telehealth (HOSPITAL_BASED_OUTPATIENT_CLINIC_OR_DEPARTMENT_OTHER): Payer: Self-pay

## 2024-08-14 ENCOUNTER — Ambulatory Visit (HOSPITAL_BASED_OUTPATIENT_CLINIC_OR_DEPARTMENT_OTHER): Payer: Self-pay | Admitting: Pulmonary Disease

## 2024-08-14 DIAGNOSIS — R058 Other specified cough: Secondary | ICD-10-CM

## 2024-08-14 NOTE — Telephone Encounter (Signed)
 Appt made.

## 2024-08-14 NOTE — Telephone Encounter (Signed)
 Pt preferred to see Dr. Kassie but will call back if symptoms worsen. Currently no SOB or wheezing, chronic cough.  [facility] T[facility] - Initial Assessment Questions Chief Complaint (e.g., cough, sob, wheezing, fever, chills, sweat or additional symptoms) *Go to specific symptom protocol after initial questions. Chronic cough  How long have symptoms been present? 2+ years  Have you tested for COVID or Flu? Note: If not, ask patient if a home test can be taken. If so, instruct patient to call back for positive results. No  MEDICINES:   Have you used any OTC meds to help with symptoms? No If yes, ask What medications? NA  Have you used your inhalers/maintenance medication? No If yes, What medications? Has albuterol , will try using that  If inhaler, ask How many puffs and how often? Note: Review instructions on medication in the chart. Sig: INHALE 2 PUFFS INTO THE LUNGS EVERY 4 HOURS AS NEEDED FOR WHEEZING OR SHORTNESS OF BREATH. MUST SCHEDULE VISIT FOR REFILLS   OXYGEN: Do you wear supplemental oxygen? No If yes, How many liters are you supposed to use? NA  Do you monitor your oxygen levels? No If yes, What is your reading (oxygen level) today? NA  What is your usual oxygen saturation reading?  (Note: Pulmonary O2 sats should be 90% or greater) NA  FYI Only or Action Required?: FYI only for provider: appointment scheduled on 09/05/24.  Patient is followed in Pulmonology for chronic cough, last seen on 03/14/2023 by Michelle Acquanetta Bradley, MD.  Called Nurse Triage reporting Cough.  Symptoms began 2+ years ago.  Interventions attempted: Nothing.  Symptoms are: gradually worsening.  Triage Disposition: See PCP Within 2 Weeks  Patient/caregiver understands and will follow disposition?:   Reason for Disposition  Cough lasts > 3 weeks  Answer Assessment - Initial Assessment Questions 1. ONSET: When did the cough begin?      Chronic x 2 years 2.  SEVERITY: How bad is the cough today?      3-4 episodes a day that are severe 3. SPUTUM: Describe the color of your sputum (e.g., none, dry cough; clear, white, yellow, green)     Denies 4. HEMOPTYSIS: Are you coughing up any blood? If so ask: How much? (e.g., flecks, streaks, tablespoons, etc.)     Denies 5. DIFFICULTY BREATHING: Are you having difficulty breathing? If Yes, ask: How bad is it? (e.g., mild, moderate, severe)      Denies 6. FEVER: Do you have a fever? If Yes, ask: What is your temperature, how was it measured, and when did it start?     Denies 7. CARDIAC HISTORY: Do you have any history of heart disease? (e.g., heart attack, congestive heart failure)      Murmur 8. LUNG HISTORY: Do you have any history of lung disease?  (e.g., pulmonary embolus, asthma, emphysema)     Chronic cough 9. OTHER SYMPTOMS: Do you have any other symptoms? (e.g., runny nose, wheezing, chest pain)       Denies  Protocols used: Cough - Chronic-A-AH   Copied from CRM #[id number]. Topic: Clinical - Red Word Triage >> Aug 14, 2024  9:48 AM Ismael A wrote: Red Word that prompted transfer to Nurse Triage: worsening non productive cough, wheezing sometimes  Past Medical History:  Diagnosis Date   ADHD (attention deficit hyperactivity disorder) 2015   Anxiety 2000   Depression    Heart murmur    diagnosed in her 20's, hasn't been heard lately   Hyperthyroidism 2000  per pt, it corrected itself   Migraine   -Chronic cough

## 2024-08-14 NOTE — Telephone Encounter (Signed)
Pt notified V/M

## 2024-08-14 NOTE — Telephone Encounter (Signed)
 CXR ordered. Please let her know she can come in at her convenience before the next appointment.

## 2024-08-14 NOTE — Telephone Encounter (Signed)
 Copied from CRM #[id number]. Topic: Clinical - Request for Lab/Test Order >> Aug 14, 2024  9:46 AM Ismael A wrote: Reason for CRM: pt states she had a CXR done in July 2024 and was told she would have to get another one done and she would receive a call back to get it scheduled but she hasn't heard back - she is wondering when she can get that next CXR done, would prefer to have it done before the end of the year since she is changing insurances

## 2024-09-05 ENCOUNTER — Ambulatory Visit (HOSPITAL_BASED_OUTPATIENT_CLINIC_OR_DEPARTMENT_OTHER): Admitting: Pulmonary Disease
